# Patient Record
Sex: Female | Born: 1990 | Race: White | Hispanic: No | Marital: Married | Smoking: Former smoker
Health system: Southern US, Community
[De-identification: ages and names within clinical notes are randomized; demographics above are authoritative.]

## PROBLEM LIST (undated history)

## (undated) DIAGNOSIS — G43909 Migraine, unspecified, not intractable, without status migrainosus: Secondary | ICD-10-CM

## (undated) DIAGNOSIS — Z9109 Other allergy status, other than to drugs and biological substances: Secondary | ICD-10-CM

## (undated) DIAGNOSIS — F32A Depression, unspecified: Secondary | ICD-10-CM

## (undated) DIAGNOSIS — F329 Major depressive disorder, single episode, unspecified: Secondary | ICD-10-CM

## (undated) DIAGNOSIS — M6289 Other specified disorders of muscle: Secondary | ICD-10-CM

## (undated) DIAGNOSIS — F419 Anxiety disorder, unspecified: Secondary | ICD-10-CM

## (undated) HISTORY — PX: CYST REMOVAL NECK: SHX6281

## (undated) HISTORY — DX: Anxiety disorder, unspecified: F41.9

---
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
## 2013-04-29 DIAGNOSIS — O09891 Supervision of other high risk pregnancies, first trimester: Secondary | ICD-10-CM | POA: Insufficient documentation

## 2013-04-29 DIAGNOSIS — O09899 Supervision of other high risk pregnancies, unspecified trimester: Secondary | ICD-10-CM | POA: Insufficient documentation

## 2013-04-29 DIAGNOSIS — Z820 Family history of epilepsy and other diseases of the nervous system: Secondary | ICD-10-CM | POA: Insufficient documentation

## 2013-04-29 DIAGNOSIS — F329 Major depressive disorder, single episode, unspecified: Secondary | ICD-10-CM | POA: Insufficient documentation

## 2013-06-17 DIAGNOSIS — O09299 Supervision of pregnancy with other poor reproductive or obstetric history, unspecified trimester: Secondary | ICD-10-CM | POA: Insufficient documentation

## 2013-07-05 DIAGNOSIS — O365990 Maternal care for other known or suspected poor fetal growth, unspecified trimester, not applicable or unspecified: Secondary | ICD-10-CM | POA: Insufficient documentation

## 2013-09-02 DIAGNOSIS — Z7189 Other specified counseling: Secondary | ICD-10-CM | POA: Insufficient documentation

## 2017-07-30 LAB — HM PAP SMEAR: HM Pap smear: NORMAL

## 2018-05-14 DIAGNOSIS — G43909 Migraine, unspecified, not intractable, without status migrainosus: Secondary | ICD-10-CM | POA: Insufficient documentation

## 2018-05-14 DIAGNOSIS — F329 Major depressive disorder, single episode, unspecified: Secondary | ICD-10-CM | POA: Insufficient documentation

## 2018-05-24 ENCOUNTER — Ambulatory Visit
Admission: EM | Admit: 2018-05-24 | Discharge: 2018-05-24 | Disposition: A | Discharge status: Home / Self Care | Payer: Self-pay | Attending: Family Medicine | Admitting: Family Medicine

## 2018-05-24 ENCOUNTER — Other Ambulatory Visit: Payer: Self-pay

## 2018-05-24 DIAGNOSIS — J029 Acute pharyngitis, unspecified: Secondary | ICD-10-CM

## 2018-05-24 DIAGNOSIS — J069 Acute upper respiratory infection, unspecified: Secondary | ICD-10-CM

## 2018-05-24 LAB — RAPID STREP SCREEN (MED CTR MEBANE ONLY): STREPTOCOCCUS, GROUP A SCREEN (DIRECT): NEGATIVE

## 2018-05-24 MED ORDER — LIDOCAINE VISCOUS HCL 2 % MT SOLN: 10.0000 mL | Freq: Four times a day (QID) | OROMUCOSAL | 0 refills | Status: DC | PRN

## 2018-05-24 NOTE — ED Provider Notes (Signed)
[facility] ____________________________________________  Time seen: Approximately 9:32 AM  I have reviewed the triage vital signs and the nursing notes.   HISTORY  Chief Complaint Sore Throat  HPI Dominique Navarro is a 27 y.o. female presenting for evaluation of sore throat present since Friday.  Reports accompanying runny nose, nasal congestion and some cough.  States sore throat is painful with swallowing as well as talking.  Currently moderate.  Has not taken any over-the-counter medications for the same complaints.  Reports her husband has had some similar sickness.  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  Continues to drink fluids well, decreased appetite.  Denies known fevers.  Denies other aggravating alleviating factors.  Reports otherwise feels well. Denies recent sickness. Denies recent antibiotic use.   No LMP recorded. Patient has had an injection. Denies pregnancy.     History reviewed. No pertinent past medical history.  There are no active problems to display for this patient.   Past Surgical History:  Procedure Laterality Date  . CYST REMOVAL NECK       No current facility-administered medications for this encounter.   Current Outpatient Medications:  .  lidocaine (XYLOCAINE) 2 % solution, Use as directed 10 mLs in the mouth or throat every 6 (six) hours as needed (sore throat. gargle and spit as needed for sore throat.)., Disp: 100 mL, Rfl: 0  Allergies Sulfa antibiotics  Family History  Problem Relation Age of Onset  . Asthma Mother   . COPD Father     Social History Social History   Tobacco Use  . Smoking status: Current Some Day Smoker  . Smokeless tobacco: Never Used  Substance Use Topics  . Alcohol use: Not Currently  . Drug use: Not Currently    Review of Systems Constitutional: No fever ENT: Positive sore throat. Cardiovascular: Denies chest pain. Respiratory: Denies shortness of  breath. Gastrointestinal: No abdominal pain.  Musculoskeletal: Negative for back pain. Skin: Negative for rash.   ____________________________________________   PHYSICAL EXAM:  VITAL SIGNS: ED Triage Vitals  Enc Vitals Group     BP 05/24/18 0850 116/86     Pulse Rate 05/24/18 0850 70     Resp 05/24/18 0850 18     Temp 05/24/18 0850 98.3 F (36.8 C)     Temp Source 05/24/18 0850 Oral     SpO2 05/24/18 0850 100 %     Weight 05/24/18 0848 139 lb (63 kg)     Height 05/24/18 0848 5\' 4"  (1.626 m)     Head Circumference --      Peak Flow --      Pain Score 05/24/18 0848 9     Pain Loc --      Pain Edu? --      Excl. in [place]? --     Constitutional: Alert and oriented. Well appearing and in no acute distress. Eyes: Conjunctivae are normal.  Head: Atraumatic. No sinus tenderness to palpation. No swelling. No erythema.  Ears: no erythema, normal TMs bilaterally.   Nose:Nasal congestion with clear rhinorrhea.   Mouth/Throat: Mucous membranes are moist. Mild pharyngeal erythema. No tonsillar swelling or exudate.  Neck: No stridor.  No cervical spine tenderness to palpation. Hematological/Lymphatic/Immunilogical: No cervical lymphadenopathy. Cardiovascular: Normal rate, regular rhythm. Grossly normal heart sounds.  Good peripheral circulation. Respiratory: Normal respiratory effort.  No retractions. No wheezes, rales or rhonchi. Good air movement.  Musculoskeletal: Ambulatory with steady gait.  Neurologic:  Normal speech and language.  No gait instability. Skin:  Skin appears warm, dry and intact. No rash noted. Psychiatric: Mood and affect are normal. Speech and behavior are normal.  ___________________________________________   LABS (all labs ordered are listed, but only abnormal results are displayed)  Labs Reviewed  RAPID STREP SCREEN ([facility] ONLY)  CULTURE, GROUP A STREP [facility])   ____________________________________   PROCEDURES Procedures    INITIAL  IMPRESSION / ASSESSMENT AND PLAN / ED COURSE  Pertinent labs & imaging results that were available during my care of the patient were reviewed by me and considered in my medical decision making (see chart for details).  Well-appearing patient.  No acute distress.  Quick strep negative, will culture.  Suspect viral upper respiratory infection encourage rest, fluids, over-the-counter cough and congestion medications as needed, Rx given for viscous lidocaine for supportive care.  Work note given for today and tomorrow.Discussed indication, risks and benefits of medications with patient.  Discussed follow up with Primary care physician this week. Discussed follow up and return parameters including no resolution or any worsening concerns. Patient verbalized understanding and agreed to plan.   ____________________________________________   FINAL CLINICAL IMPRESSION(S) / ED DIAGNOSES  Final diagnoses:  Pharyngitis, unspecified etiology  Upper respiratory tract infection, unspecified type     ED Discharge Orders         Ordered    lidocaine (XYLOCAINE) 2 % solution  Every 6 hours PRN     05/24/18 0924           Note: This dictation was prepared with Dragon dictation along with smaller phrase technology. Any transcriptional errors that result from this process are unintentional.         Marylene Land, NP 05/24/18 1040

## 2018-05-24 NOTE — ED Triage Notes (Signed)
Patient complains of sore throat that started on Friday. Patient reports painful swallowing and talking.

## 2018-05-24 NOTE — Discharge Instructions (Addendum)
Take medication as prescribed. Rest. Drink plenty of fluids. Over the counter medication as discussed.  ° °Follow up with your primary care physician this week as needed. Return to Urgent care for new or worsening concerns.  ° °

## 2018-05-26 LAB — CULTURE, GROUP A STREP (THRC)

## 2018-06-03 ENCOUNTER — Emergency Department
Admission: EM | Admit: 2018-06-03 | Discharge: 2018-06-03 | Disposition: A | Discharge status: Home / Self Care | Payer: Medicaid Other | Attending: Emergency Medicine | Admitting: Emergency Medicine

## 2018-06-03 ENCOUNTER — Other Ambulatory Visit: Payer: Self-pay

## 2018-06-03 DIAGNOSIS — B9789 Other viral agents as the cause of diseases classified elsewhere: Secondary | ICD-10-CM | POA: Insufficient documentation

## 2018-06-03 DIAGNOSIS — J069 Acute upper respiratory infection, unspecified: Secondary | ICD-10-CM | POA: Diagnosis not present

## 2018-06-03 DIAGNOSIS — F17210 Nicotine dependence, cigarettes, uncomplicated: Secondary | ICD-10-CM | POA: Diagnosis not present

## 2018-06-03 DIAGNOSIS — J020 Streptococcal pharyngitis: Secondary | ICD-10-CM | POA: Diagnosis not present

## 2018-06-03 DIAGNOSIS — J029 Acute pharyngitis, unspecified: Secondary | ICD-10-CM | POA: Diagnosis present

## 2018-06-03 LAB — GROUP A STREP BY PCR: GROUP A STREP BY PCR: NOT DETECTED

## 2018-06-03 MED ORDER — PREDNISONE 20 MG PO TABS
60.0000 mg | ORAL_TABLET | Freq: Once | ORAL | Status: AC
  Administered 2018-06-03: 60 mg via ORAL
  Filled 2018-06-03: qty 3

## 2018-06-03 MED ORDER — PREDNISONE 10 MG (21) PO TBPK: ORAL_TABLET | ORAL | 0 refills | Status: DC

## 2018-06-03 MED ORDER — BENZONATATE 100 MG PO CAPS: ORAL_CAPSULE | ORAL | 0 refills | Status: DC

## 2018-06-03 NOTE — ED Notes (Signed)
Pt ambulatory to POV without difficulty. VSS. NAD. Discharge instructions, RX and follow up reviewed. All questions and concerns addressed.  

## 2018-06-03 NOTE — Discharge Instructions (Addendum)
Your exam was indicative of a viral URI; and rapid strep test were negative. Take the prescription meds as directed. Drink plenty of fluids and gargle with warm salty water. You should start an OTC allergy medicine as well as Delsym (dextromethorphan) for cough relief. Follow-up with [facility] for routine medical care.

## 2018-06-03 NOTE — ED Provider Notes (Signed)
[facility] Emergency Department Provider Note ____________________________________________  Time seen: 1653  I have reviewed the triage vital signs and the nursing notes.  HISTORY  Chief Complaint  Sore Throat  HPI Dominique Navarro is a 27 y.o. female presents herself to the ED for evaluation of cough, congestion, sore throat, and nasal drainage.  Patient describes symptoms started over a week ago.  She denies any sick contacts, recent travel, or other exposures.  She was concerned because she thought she saw some swelling and some "splotches" inside her mouth.  History reviewed. No pertinent past medical history.  There are no active problems to display for this patient.   Past Surgical History:  Procedure Laterality Date  . CYST REMOVAL NECK      Prior to Admission medications   Medication Sig Start Date End Date Taking? Authorizing Provider  benzonatate (TESSALON PERLES) 100 MG capsule Take 1-2 tabs TID prn cough 06/03/18   Torin Modica, Dannielle Karvonen, PA-C  lidocaine (XYLOCAINE) 2 % solution Use as directed 10 mLs in the mouth or throat every 6 (six) hours as needed (sore throat. gargle and spit as needed for sore throat.). 05/24/18   Marylene Land, NP  predniSONE (STERAPRED UNI-PAK 21 TAB) 10 MG (21) TBPK tablet 6-day taper as directed. 06/03/18   Tearia Gibbs, Dannielle Karvonen, PA-C    Allergies Sulfa antibiotics  Family History  Problem Relation Age of Onset  . Asthma Mother   . COPD Father     Social History Social History   Tobacco Use  . Smoking status: Current Some Day Smoker  . Smokeless tobacco: Never Used  Substance Use Topics  . Alcohol use: Not Currently  . Drug use: Not Currently    Review of Systems  Constitutional: Negative for fever. Eyes: Negative for visual changes. ENT: Positive for sore throat.  Reports sinus congestion and nasal drainage Cardiovascular: Negative for chest pain. Respiratory: Negative for shortness of  breath. Gastrointestinal: Negative for abdominal pain, vomiting and diarrhea. Genitourinary: Negative for dysuria. Musculoskeletal: Negative for back pain. Skin: Negative for rash. Neurological: Negative for headaches, focal weakness or numbness. ____________________________________________  PHYSICAL EXAM:  VITAL SIGNS: ED Triage Vitals  Enc Vitals Group     BP 06/03/18 1502 120/68     Pulse Rate 06/03/18 1502 82     Resp 06/03/18 1502 18     Temp 06/03/18 1500 98.7 F (37.1 C)     Temp Source 06/03/18 1500 Oral     SpO2 06/03/18 1502 99 %     Weight 06/03/18 1500 139 lb (63 kg)     Height 06/03/18 1500 5\' 4"  (1.626 m)     Head Circumference --      Peak Flow --      Pain Score 06/03/18 1500 10     Pain Loc --      Pain Edu? --      Excl. in [place]? --     Constitutional: Alert and oriented. Well appearing and in no distress. Head: Normocephalic and atraumatic. Eyes: Conjunctivae are normal. Normal extraocular movements Ears: Canals clear. TMs intact bilaterally. Nose: No congestion/rhinorrhea/epistaxis. Mouth/Throat: Mucous membranes are moist.  Uvula is midline and tonsils are without erythema, edema, or exudates.  No oropharyngeal lesions are noted.  There is some mild erythema to the posterior oropharynx, consistent with likely postnasal drainage. Neck: Supple. No thyromegaly. Hematological/Lymphatic/Immunological: No cervical lymphadenopathy. Cardiovascular: Normal rate, regular rhythm. Normal distal pulses. Respiratory: Normal respiratory effort. No wheezes/rales/rhonchi. Gastrointestinal: Soft and  nontender. No distention. ____________________________________________   LABS (pertinent positives/negatives) Labs Reviewed  GROUP A STREP BY PCR  ____________________________________________  PROCEDURES  Procedures Prednisone 60 mg PO ____________________________________________  INITIAL IMPRESSION / ASSESSMENT AND PLAN / ED COURSE  She with ED evaluation of sore  throat and cough with sinus congestion.  Patient's exam is overall benign.  Her strep PCR was negative at this time for bacterial etiology to her sore throat.  Symptoms likely represent a viral etiology versus sore throat due to postnasal drainage and cough.  She will be treated empirically with steroids, Tessalon Perles, and is encouraged to take over-the-counter Delsym for symptom relief.  She should also consider starting a daily allergy medicine.  Patient will follow-up with local community clinic for ongoing symptoms or return to the ED as needed.  Work note is provided for today as requested. ____________________________________________  FINAL CLINICAL IMPRESSION(S) / ED DIAGNOSES  Final diagnoses:  Viral URI with cough  Strep throat      Tyeasha Ebbs, Dannielle Karvonen, PA-C 06/04/18 0000    Arta Silence, MD 06/04/18 1601

## 2018-06-03 NOTE — ED Notes (Signed)
See triage note  Presents with sore throat for the past couple of days  No fever  States pain increases with swallowing

## 2018-06-03 NOTE — ED Triage Notes (Signed)
Pt comes with c/o sore throat that started over a week ago. Pt states she has swelling and some splotches noticed inside her mouth.

## 2019-04-28 ENCOUNTER — Other Ambulatory Visit: Payer: Self-pay

## 2019-04-28 ENCOUNTER — Ambulatory Visit (LOCAL_COMMUNITY_HEALTH_CENTER): Payer: Medicaid Other

## 2019-04-28 VITALS — BP 105/68 | Ht 64.0 in | Wt 150.5 lb

## 2019-04-28 DIAGNOSIS — Z3009 Encounter for other general counseling and advice on contraception: Secondary | ICD-10-CM

## 2019-04-28 DIAGNOSIS — Z30013 Encounter for initial prescription of injectable contraceptive: Secondary | ICD-10-CM

## 2019-04-28 MED ORDER — MEDROXYPROGESTERONE ACETATE 150 MG/ML IM SUSP
150.0000 mg | Freq: Once | INTRAMUSCULAR | Status: AC
  Administered 2019-04-28: 150 mg via INTRAMUSCULAR

## 2019-04-28 NOTE — Progress Notes (Signed)
Depo administered per 05/14/2018 written order of Jerline Pain FNP-BC. Client tolerated without complaint. Rich Number, RN

## 2019-05-02 ENCOUNTER — Other Ambulatory Visit: Payer: Self-pay

## 2019-05-02 ENCOUNTER — Ambulatory Visit
Admission: EM | Admit: 2019-05-02 | Discharge: 2019-05-02 | Disposition: A | Discharge status: Home / Self Care | Payer: Medicaid Other | Attending: Family Medicine | Admitting: Family Medicine

## 2019-05-02 DIAGNOSIS — K0889 Other specified disorders of teeth and supporting structures: Secondary | ICD-10-CM | POA: Diagnosis not present

## 2019-05-02 MED ORDER — HYDROCODONE-ACETAMINOPHEN 5-325 MG PO TABS: 1.0000 | ORAL_TABLET | Freq: Three times a day (TID) | ORAL | 0 refills | Status: DC | PRN

## 2019-05-02 MED ORDER — LIDOCAINE VISCOUS HCL 2 % MT SOLN: OROMUCOSAL | 0 refills | Status: DC

## 2019-05-02 MED ORDER — AMOXICILLIN-POT CLAVULANATE 875-125 MG PO TABS: 1.0000 | ORAL_TABLET | Freq: Two times a day (BID) | ORAL | 0 refills | Status: DC

## 2019-05-02 NOTE — ED Provider Notes (Signed)
[facility]    CSN: [id number] Arrival date & time: 05/02/19  1046  History   Chief Complaint Chief Complaint  Patient presents with  . Dental Pain   HPI  28 year old female presents with dental pain.  Patient reports pain associated with her right lower last tooth.  She states that this is a wisdom tooth that is bothering her.  It has erupted through the gum but is now causing pain.  Pain is 7/10 in severity.  Reports some associated swelling of her right lower jaw.  No fever.  No chills.  She has been unable to get in to see a dentist.  She has taken ibuprofen without relief.  She reports difficulty eating but is able to drink.  Exacerbated by touch.  Pending factors.  No other associated symptoms.  No other complaints.  PMH, Surgical Hx, Family Hx, Social History reviewed and updated as below.  PMH: Patient Active Problem List   Diagnosis Date Noted  . Depression 05/14/2018  . Migraine headache 05/14/2018    Past Surgical History:  Procedure Laterality Date  . CYST REMOVAL NECK      OB History   No obstetric history on file.      Home Medications    Prior to Admission medications   Medication Sig Start Date End Date Taking? Authorizing Provider  amoxicillin-clavulanate (AUGMENTIN) 875-125 MG tablet Take 1 tablet by mouth every 12 (twelve) hours. 05/02/19   Coral Spikes, DO  HYDROcodone-acetaminophen (NORCO/VICODIN) 5-325 MG tablet Take 1 tablet by mouth every 8 (eight) hours as needed. 05/02/19   Coral Spikes, DO  lidocaine (XYLOCAINE) 2 % solution Apply a small amount to the affected area every 3 hours as needed. 05/02/19   Coral Spikes, DO  Multiple Vitamin (MULTIVITAMIN WITH MINERALS) TABS tablet Take 1 tablet by mouth daily.    [provider]    Family History Family History  Problem Relation Age of Onset  . Asthma Mother   . COPD Father   . Seizures Son     Social History Social History   Tobacco Use  . Smoking status: Former  Smoker    Types: Cigarettes    Quit date: 02/23/2019    Years since quitting: 0.1  . Smokeless tobacco: Never Used  Substance Use Topics  . Alcohol use: Not Currently  . Drug use: Not Currently     Allergies   Bee venom and Sulfa antibiotics   Review of Systems Review of Systems  Constitutional: Negative.   HENT: Positive for dental problem.    Physical Exam Triage Vital Signs ED Triage Vitals  Enc Vitals Group     BP 05/02/19 1102 124/90     Pulse Rate 05/02/19 1102 77     Resp 05/02/19 1102 18     Temp 05/02/19 1102 98.9 F (37.2 C)     Temp Source 05/02/19 1102 Oral     SpO2 05/02/19 1102 99 %     Weight 05/02/19 1104 150 lb (68 kg)     Height --      Head Circumference --      Peak Flow --      Pain Score 05/02/19 1104 7     Pain Loc --      Pain Edu? --      Excl. in [place]? --    Updated Vital Signs BP 124/90 (BP Location: Right Arm)   Pulse 77   Temp 98.9 F (37.2 C) (Oral)  Resp 18   Wt 68 kg   LMP  (LMP Unknown)   SpO2 99%   BMI 25.75 kg/m   Visual Acuity Right Eye Distance:   Left Eye Distance:   Bilateral Distance:    Right Eye Near:   Left Eye Near:    Bilateral Near:     Physical Exam Vitals signs and nursing note reviewed.  Constitutional:      General: She is not in acute distress.    Appearance: Normal appearance. She is normal weight. She is not ill-appearing.  HENT:     Head: Normocephalic and atraumatic.     Mouth/Throat:     Pharynx: Oropharynx is clear. No posterior oropharyngeal erythema.      Comments: Tooth pushing through the gum.  Mild surrounding erythema.  Tender to palpation. Eyes:     General:        Right eye: No discharge.        Left eye: No discharge.     Conjunctiva/sclera: Conjunctivae normal.  Pulmonary:     Effort: Pulmonary effort is normal. No respiratory distress.  Skin:    General: Skin is warm.     Findings: No rash.  Neurological:     Mental Status: She is alert.  Psychiatric:        Mood and  Affect: Mood normal.        Behavior: Behavior normal.    UC Treatments / Results  Labs (all labs ordered are listed, but only abnormal results are displayed) Labs Reviewed - No data to display  EKG   Radiology No results found.  Procedures Procedures (including critical care time)  Medications Ordered in UC Medications - No data to display  Initial Impression / Assessment and Plan / UC Course  I have reviewed the triage vital signs and the nursing notes.  Pertinent labs & imaging results that were available during my care of the patient were reviewed by me and considered in my medical decision making (see chart for details).    28 year old female presents with dental pain.  Concern for developing infection.  Placing on Augmentin.  Viscous lidocaine as needed.  Vicodin as needed. [place] Controlled substance database reviewed.  No concerns at this time  Final Clinical Impressions(s) / UC Diagnoses   Final diagnoses:  Pain, dental     Discharge Instructions     Try [facility] in [place].  Medications as directed.  Take care  Dr. Lacinda Axon    ED Prescriptions    Medication Sig Dispense Auth. Provider   amoxicillin-clavulanate (AUGMENTIN) 875-125 MG tablet  (Status: Discontinued) Take 1 tablet by mouth every 12 (twelve) hours. 14 tablet Alberto Schoch G, DO   HYDROcodone-acetaminophen (NORCO/VICODIN) 5-325 MG tablet  (Status: Discontinued) Take 1 tablet by mouth every 8 (eight) hours as needed. 10 tablet Indigo Barbian G, DO   lidocaine (XYLOCAINE) 2 % solution  (Status: Discontinued) Apply a small amount to the affected area every 3 hours as needed. 100 mL Azalie Harbeck G, DO   amoxicillin-clavulanate (AUGMENTIN) 875-125 MG tablet Take 1 tablet by mouth every 12 (twelve) hours. 14 tablet Abdul Beirne G, DO   HYDROcodone-acetaminophen (NORCO/VICODIN) 5-325 MG tablet Take 1 tablet by mouth every 8 (eight) hours as needed. 10 tablet Norberto Wishon G, DO   lidocaine (XYLOCAINE) 2 %  solution Apply a small amount to the affected area every 3 hours as needed. 100 mL Coral Spikes, DO     Controlled Substance Prescriptions  Controlled Substance  Registry consulted? Yes, I have consulted the [place] Controlled Substances Registry for this patient, and feel the risk/benefit ratio today is favorable for proceeding with this prescription for a controlled substance.   Coral Spikes, [place] 05/02/19 1209

## 2019-05-02 NOTE — ED Triage Notes (Signed)
Pt here for impacted wisdom tooth on the right side of her mouth that has been hurting her since Saturday. Does not have a dentist since she just got insurance. Pt states she feels some swelling on the right side of her face. Did take ibuprofen without relief.

## 2019-05-02 NOTE — Discharge Instructions (Signed)
Try [facility] in [place].  Medications as directed.  Take care  Dr. Lacinda Axon

## 2019-05-19 ENCOUNTER — Ambulatory Visit: Payer: Medicaid Other | Admitting: Physician Assistant

## 2019-05-19 ENCOUNTER — Other Ambulatory Visit: Payer: Self-pay

## 2019-05-19 ENCOUNTER — Encounter: Payer: Self-pay | Admitting: Physician Assistant

## 2019-05-19 VITALS — BP 105/75 | HR 80 | Temp 97.1°F | Ht 64.0 in | Wt 153.0 lb

## 2019-05-19 DIAGNOSIS — Z862 Personal history of diseases of the blood and blood-forming organs and certain disorders involving the immune mechanism: Secondary | ICD-10-CM

## 2019-05-19 DIAGNOSIS — R5383 Other fatigue: Secondary | ICD-10-CM | POA: Diagnosis not present

## 2019-05-19 DIAGNOSIS — M2550 Pain in unspecified joint: Secondary | ICD-10-CM | POA: Diagnosis not present

## 2019-05-19 DIAGNOSIS — F329 Major depressive disorder, single episode, unspecified: Secondary | ICD-10-CM

## 2019-05-19 DIAGNOSIS — G43809 Other migraine, not intractable, without status migrainosus: Secondary | ICD-10-CM

## 2019-05-19 DIAGNOSIS — M791 Myalgia, unspecified site: Secondary | ICD-10-CM

## 2019-05-19 DIAGNOSIS — H547 Unspecified visual loss: Secondary | ICD-10-CM

## 2019-05-19 DIAGNOSIS — F32A Depression, unspecified: Secondary | ICD-10-CM

## 2019-05-19 MED ORDER — AMITRIPTYLINE HCL 10 MG PO TABS: 10.0000 mg | ORAL_TABLET | Freq: Every day | ORAL | 1 refills | Status: DC

## 2019-05-19 MED ORDER — DULOXETINE HCL 20 MG PO CPEP: 20.0000 mg | ORAL_CAPSULE | Freq: Every day | ORAL | 0 refills | Status: DC

## 2019-05-19 NOTE — Progress Notes (Signed)
Patient: Dominique Navarro Female    DOB: 11/06/1990   28 y.o.   MRN: [id number] Visit Date: 05/19/2019  Today's Provider: Trinna Post, PA-C   Chief Complaint  Patient presents with  . Establish Care   Subjective:     HPI   Moved from [place] in 11/2017  Just got insurance now. Originally from [place] area. She works at [employer]. She has an 23 year old son and 67 year old daughter. She is currently getting the depo short for birth control. She does not get periods on the depo shot.   History of Migraines. She was previously on elavil 10 mg nightly and would would like to restart this.   History of Depression. She was previously on Celexa. pt does not remember the dose but would like to restart. She is open to other medications. She went through a stressful time in her life and felt like the medication stopped working.  History of low Iron needs to be rechecked and is worried this may be contributing to fatigue.   Pt reports her body hurts all the time, "Every single joint and it has been getting worse". Reports this has been happening for months. She says her knees hurt when she goes up the stairs. She reports she feels tired frequently. She says the pain is unbearable. She has no injuries that she can recall. Denies family history of rheumatoid arthritis. She does not have any rashes that she knows of and has not been told she has psoriasis. She reports she took a tylenol with codeine for dental pain and the pain improved. She reports when she gets up she feels very stiff but denies morning stiffness that resolves in an hour.   Needs a referral for eye exam to get new glasses.  Allergies  Allergen Reactions  . Bee Venom Hives  . Sulfa Antibiotics Hives     Current Outpatient Medications:  Marland Kitchen  Multiple Vitamin (MULTIVITAMIN WITH MINERALS) TABS tablet, Take 1 tablet by mouth daily., Disp: , Rfl:  .  amitriptyline (ELAVIL) 10 MG tablet, Take 1 tablet (10 mg total) by mouth at bedtime.,  Disp: 90 tablet, Rfl: 1 .  DULoxetine (CYMBALTA) 20 MG capsule, Take 1 capsule (20 mg total) by mouth daily., Disp: 90 capsule, Rfl: 0  Review of Systems  Constitutional: Positive for fatigue. Negative for activity change, appetite change, chills, diaphoresis, fever and unexpected weight change.  HENT: Positive for dental problem. Negative for congestion, drooling, ear discharge, ear pain, facial swelling, hearing loss, mouth sores, nosebleeds, postnasal drip, rhinorrhea, sinus pressure, sinus pain, sneezing, sore throat, tinnitus, trouble swallowing and voice change.   Eyes: Negative.   Respiratory: Negative.   Cardiovascular: Negative.   Gastrointestinal: Negative.   Endocrine: Negative.   Genitourinary: Negative.   Musculoskeletal: Positive for arthralgias, back pain, joint swelling and neck pain. Negative for gait problem, myalgias and neck stiffness.  Skin: Negative.   Allergic/Immunologic: Negative.   Neurological: Negative.   Hematological: Negative.   Psychiatric/Behavioral: Positive for sleep disturbance. Negative for agitation, behavioral problems, confusion, decreased concentration, dysphoric mood, hallucinations, self-injury and suicidal ideas. The patient is nervous/anxious. The patient is not hyperactive.     Social History   Tobacco Use  . Smoking status: Former Smoker    Types: Cigarettes    Quit date: 02/23/2019    Years since quitting: 0.2  . Smokeless tobacco: Never Used  Substance Use Topics  . Alcohol use: Never    Frequency:  Never      Objective:   BP 105/75 (BP Location: Left Arm, Patient Position: Sitting, Cuff Size: Normal)   Pulse 80   Temp (!) 97.1 F (36.2 C) (Temporal)   Ht '5\' 4"'  (1.626 m)   Wt 153 lb (69.4 kg)   LMP  (LMP Unknown)   BMI 26.26 kg/m  Vitals:   05/19/19 1025  BP: 105/75  Pulse: 80  Temp: (!) 97.1 F (36.2 C)  TempSrc: Temporal  Weight: 153 lb (69.4 kg)  Height: '5\' 4"'  (1.626 m)  Body mass index is 26.26 kg/m.   Physical  Exam   No results found for any visits on 05/19/19.     Assessment & Plan    1. Other migraine without status migrainosus, not intractable  Will restart as below.  - amitriptyline (ELAVIL) 10 MG tablet; Take 1 tablet (10 mg total) by mouth at bedtime.  Dispense: 90 tablet; Refill: 1  2. Depression, unspecified depression type  We will change celexa to cymbalta due to its use in [facility] pain. Counseled on risk of serotonin syndrome and what to observe for.   - DULoxetine (CYMBALTA) 20 MG capsule; Take 1 capsule (20 mg total) by mouth daily.  Dispense: 90 capsule; Refill: 0  3. Other fatigue  - CBC with Differential - Comprehensive Metabolic Panel (CMET) - TSH - Sed Rate (ESR) - C-reactive protein  4. Arthralgia, unspecified joint  Have gone over common causes of diffuse muscle pain including osteoarthritis, inflammatory arthritis, pain syndromes, viral illnesses. I think it would be unlikely that she has osteoarthritis due to age. No family history of inflammatory arthritis. Her symptoms are not consistent with inflammatory arthritis but I think it's reasonable to get initial labs. Also counseled about possibility of pain syndrome like fibromyalgia. Will refer to rheum for further workup. Start cymbalta as mentioned above and hopefully this will help both depression and MSK pain.   - DULoxetine (CYMBALTA) 20 MG capsule; Take 1 capsule (20 mg total) by mouth daily.  Dispense: 90 capsule; Refill: 0 - Rheumatoid Factor - ANA - Ambulatory referral to Rheumatology  5. Muscle pain  - DULoxetine (CYMBALTA) 20 MG capsule; Take 1 capsule (20 mg total) by mouth daily.  Dispense: 90 capsule; Refill: 0  6. History of anemia  - Fe+TIBC+Fer  7. Vision loss  - Ambulatory referral to Ophthalmology  The entirety of the information documented in the History of Present Illness, Review of Systems and Physical Exam were personally obtained by me. Portions of this information were initially  documented by Ashley Royalty, CMA and reviewed by me for thoroughness and accuracy.   F/u 1 month depression    Trinna Post, PA-C  [facility]

## 2019-05-19 NOTE — Patient Instructions (Signed)
Depression Screening Depression screening is a tool that your health care provider can use to learn if you have symptoms of depression. Depression is a common condition with many symptoms that are also often found in other conditions. Depression is treatable, but it must first be diagnosed. You may not know that certain feelings, thoughts, and behaviors that you are having can be symptoms of depression. Taking a depression screening test can help you and your health care provider decide if you need more assessment, or if you should be referred to a mental health care provider. What are the screening tests?  You may have a physical exam to see if another condition is affecting your mental health. You may have a blood or urine sample taken during the physical exam.  You may be interviewed using a screening tool that was developed from research, such as one of these: ? Patient Health Questionnaire (PHQ). This is a set of either 2 or 9 questions. A health care provider who has been trained to score this screening test uses a guide to assess if your symptoms suggest that you may have depression. ? Hamilton Depression Rating Scale (HAM-D). This is a set of either 17 or 24 questions. You may be asked to take it again during or after your treatment, to see if your depression has gotten better. ? Beck Depression Inventory (BDI). This is a set of 21 multiple choice questions. Your health care provider scores your answers to assess:  Your level of depression, ranging from mild to severe.  Your response to treatment.  Your health care provider may talk with you about your daily activities, such as eating, sleeping, work, and recreation, and ask if you have had any changes in activity.  Your health care provider may ask you to see a mental health specialist, such as a psychiatrist or psychologist, for more evaluation. Who should be screened for depression?   All adults, including adults with a family history  of a mental health disorder.  Adolescents who are 12-18 years old.  People who are recovering from a myocardial infarction (MI).  Pregnant women, or women who have given birth.  People who have a long-term (chronic) illness.  Anyone who has been diagnosed with another type of a mental health disorder.  Anyone who has symptoms that could show depression. What do my results mean? Your health care provider will review the results of your depression screening, physical exam, and lab tests. Positive screens suggest that you may have depression. Screening is the first step in getting the care that you may need. It is up to you to get your screening results. Ask your health care provider, or the department that is doing your screening tests, when your results will be ready. Talk with your health care provider about your results and diagnosis. A diagnosis of depression is made using the Diagnostic and Statistical Manual of Mental Disorders (DSM-V). This is a book that lists the number and type of symptoms that must be present for a health care provider to give a specific diagnosis.  Your health care provider may work with you to treat your symptoms of depression, or your health care provider may help you find a mental health provider who can assess, diagnose, and treat your depression. Get help right away if:  You have thoughts about hurting yourself or others. If you ever feel like you may hurt yourself or others, or have thoughts about taking your own life, get help right away. You   can go to your nearest emergency department or call:  Your local emergency services (911 in the [place]  A suicide crisis helpline, such as the National Suicide Prevention Lifeline at 1-800-273-8255. This is open 24 hours a day. Summary  Depression screening is the first step in getting the help that you may need.  If your screening test shows symptoms of depression (is positive), your health care provider may ask  you to see a mental health provider.  Anyone who is age 12 or older should be screened for depression. This information is not intended to replace advice given to you by your health care provider. Make sure you discuss any questions you have with your health care provider. Document Released: 12/26/2016 Document Revised: 07/24/2017 Document Reviewed: 12/26/2016 Elsevier Patient Education  2020 Elsevier Inc[employer]  

## 2019-05-20 LAB — SEDIMENTATION RATE: Sed Rate: 2 mm/hr (ref 0–32)

## 2019-05-20 LAB — TSH: TSH: 1.64 u[IU]/mL (ref 0.450–4.500)

## 2019-05-20 LAB — CBC WITH DIFFERENTIAL/PLATELET
Basophils Absolute: 0.1 10*3/uL (ref 0.0–0.2)
Basos: 1 %
EOS (ABSOLUTE): 0.3 10*3/uL (ref 0.0–0.4)
Eos: 5 %
Hematocrit: 40.6 % (ref 34.0–46.6)
Hemoglobin: 13.8 g/dL (ref 11.1–15.9)
Immature Grans (Abs): 0 10*3/uL (ref 0.0–0.1)
Immature Granulocytes: 0 %
Lymphocytes Absolute: 2.2 10*3/uL (ref 0.7–3.1)
Lymphs: 38 %
MCH: 28.1 pg (ref 26.6–33.0)
MCHC: 34 g/dL (ref 31.5–35.7)
MCV: 83 fL (ref 79–97)
Monocytes Absolute: 0.5 10*3/uL (ref 0.1–0.9)
Monocytes: 9 %
Neutrophils Absolute: 2.7 10*3/uL (ref 1.4–7.0)
Neutrophils: 47 %
Platelets: 241 10*3/uL (ref 150–450)
RBC: 4.91 x10E6/uL (ref 3.77–5.28)
RDW: 12.4 % (ref 11.7–15.4)
WBC: 5.7 10*3/uL (ref 3.4–10.8)

## 2019-05-20 LAB — COMPREHENSIVE METABOLIC PANEL
ALT: 22 IU/L (ref 0–32)
AST: 21 IU/L (ref 0–40)
Albumin/Globulin Ratio: 2.6 — ABNORMAL HIGH (ref 1.2–2.2)
Albumin: 5.2 g/dL — ABNORMAL HIGH (ref 3.9–5.0)
Alkaline Phosphatase: 60 IU/L (ref 39–117)
BUN/Creatinine Ratio: 13 (ref 9–23)
BUN: 12 mg/dL (ref 6–20)
Bilirubin Total: 0.4 mg/dL (ref 0.0–1.2)
CO2: 18 mmol/L — ABNORMAL LOW (ref 20–29)
Calcium: 9.3 mg/dL (ref 8.7–10.2)
Chloride: 108 mmol/L — ABNORMAL HIGH (ref 96–106)
Creatinine, Ser: 0.94 mg/dL (ref 0.57–1.00)
GFR calc Af Amer: 95 mL/min/{1.73_m2} (ref >59)
GFR calc non Af Amer: 82 mL/min/{1.73_m2} (ref >59)
Globulin, Total: 2 g/dL (ref 1.5–4.5)
Glucose: 84 mg/dL (ref 65–99)
Potassium: 4 mmol/L (ref 3.5–5.2)
Sodium: 141 mmol/L (ref 134–144)
Total Protein: 7.2 g/dL (ref 6.0–8.5)

## 2019-05-20 LAB — IRON,TIBC AND FERRITIN PANEL
Ferritin: 73 ng/mL (ref 15–150)
Iron Saturation: 62 % — ABNORMAL HIGH (ref 15–55)
Iron: 138 ug/dL (ref 27–159)
Total Iron Binding Capacity: 222 ug/dL — ABNORMAL LOW (ref 250–450)
UIBC: 84 ug/dL — ABNORMAL LOW (ref 131–425)

## 2019-05-20 LAB — C-REACTIVE PROTEIN: CRP: <1 mg/L (ref 0–10)

## 2019-05-20 LAB — RHEUMATOID FACTOR: Rhuematoid fact SerPl-aCnc: <10 IU/mL (ref 0.0–13.9)

## 2019-05-20 LAB — ANA: Anti Nuclear Antibody (ANA): NEGATIVE

## 2019-05-24 ENCOUNTER — Telehealth: Payer: Self-pay | Admitting: Physician Assistant

## 2019-06-06 DIAGNOSIS — G4709 Other insomnia: Secondary | ICD-10-CM | POA: Insufficient documentation

## 2019-06-06 DIAGNOSIS — Z13820 Encounter for screening for osteoporosis: Secondary | ICD-10-CM | POA: Insufficient documentation

## 2019-06-06 DIAGNOSIS — M2550 Pain in unspecified joint: Secondary | ICD-10-CM | POA: Insufficient documentation

## 2019-06-08 ENCOUNTER — Telehealth: Payer: Self-pay | Admitting: Physician Assistant

## 2019-06-08 NOTE — Telephone Encounter (Signed)
Please advise 

## 2019-06-08 NOTE — Telephone Encounter (Signed)
It looks like the rheumatologist wanted to see her back in 4 weeks and she has an appointment on 07/13/2019. I think it would make sense to follow up but if she wants another referral I will place it. The outcome may not be different, however.

## 2019-06-08 NOTE — Telephone Encounter (Signed)
°  Pt requesting a 2nd Rheumatologist referral.  She didn't feel the 1st one listened and couldn't find anything wrong.  Please call pt back to let her know if this can be done and the status.  Thanks, [employer]

## 2019-06-08 NOTE — Telephone Encounter (Signed)
Patient was advised. Patient stated she will keep follow up appt 07/13/2019 with rheumatologist.

## 2019-07-12 ENCOUNTER — Ambulatory Visit (INDEPENDENT_AMBULATORY_CARE_PROVIDER_SITE_OTHER): Payer: Medicaid Other | Admitting: Physician Assistant

## 2019-07-12 DIAGNOSIS — F32A Depression, unspecified: Secondary | ICD-10-CM

## 2019-07-12 DIAGNOSIS — G43009 Migraine without aura, not intractable, without status migrainosus: Secondary | ICD-10-CM

## 2019-07-12 DIAGNOSIS — F329 Major depressive disorder, single episode, unspecified: Secondary | ICD-10-CM | POA: Diagnosis not present

## 2019-07-12 MED ORDER — SUMATRIPTAN SUCCINATE 50 MG PO TABS: 50.0000 mg | ORAL_TABLET | ORAL | 1 refills | Status: DC | PRN

## 2019-07-12 NOTE — Progress Notes (Signed)
Patient: Dominique Navarro Female    DOB: Sep 25, 1990   28 y.o.   MRN: [id number] Visit Date: 07/12/2019  Today's Provider: Trinna Post, PA-C   Chief Complaint  Patient presents with  . Depression   Subjective:    Virtual Visit via Video Note  I connected with Dominique Navarro on [id number] at  9:40 AM EST by a video enabled telemedicine application and verified that I am speaking with the correct person using two identifiers.  Location: Patient: Home Provider: Office   I discussed the limitations of evaluation and management by telemedicine and the availability of in person appointments. The patient expressed understanding and agreed to proceed.  HPI Depression Patient presents today virtually for depression follow-up. Patient last office visit was on 05/19/2019. Patient Celexa was changed to Cymbalta. Patient denies any side effects at the moment from the medication.  Patient reports improvement in migraines and restarting Elavil. Had some that did break through.   Rheumatology: follow up tomorrow. She did get meloxicam and flexeril from doctor. She reports flexeril helping significantly.  Depression screen [facility] 2/9 07/12/2019 05/19/2019  Decreased Interest 0 1  Down, Depressed, Hopeless 0 2  PHQ - 2 Score 0 3  Altered sleeping 2 2  Tired, decreased energy 1 3  Change in appetite 0 1  Feeling bad or failure about yourself  0 1  Trouble concentrating 0 0  Moving slowly or fidgety/restless 0 0  Suicidal thoughts 0 0  PHQ-9 Score 3 10  Difficult doing work/chores Not difficult at all Not difficult at all    Allergies  Allergen Reactions  . Bee Venom Hives  . Sulfa Antibiotics Hives     Current Outpatient Medications:  .  amitriptyline (ELAVIL) 10 MG tablet, Take 1 tablet (10 mg total) by mouth at bedtime., Disp: 90 tablet, Rfl: 1 .  DULoxetine (CYMBALTA) 20 MG capsule, Take 1 capsule (20 mg total) by mouth daily., Disp: 90 capsule, Rfl: 0 .  Multiple Vitamin  (MULTIVITAMIN WITH MINERALS) TABS tablet, Take 1 tablet by mouth daily., Disp: , Rfl:   Review of Systems  Social History   Tobacco Use  . Smoking status: Former Smoker    Types: Cigarettes    Quit date: 02/23/2019    Years since quitting: 0.3  . Smokeless tobacco: Never Used  Substance Use Topics  . Alcohol use: Never    Frequency: Never      Objective:   There were no vitals taken for this visit. There were no vitals filed for this visit.There is no height or weight on file to calculate BMI.   Physical Exam Constitutional:      Appearance: Normal appearance.  Pulmonary:     Effort: Pulmonary effort is normal. No respiratory distress.  Neurological:     Mental Status: She is alert.  Psychiatric:        Mood and Affect: Mood normal.        Behavior: Behavior normal.      No results found for any visits on 07/12/19.     Assessment & Plan    1. Migraine without aura and without status migrainosus, not intractable  Reports migraines have improved but had 1-2 break through migraines in the past month that did not respond to OTC medications. Will prescribe as below. Continue elavil 10 mg QHS.   - SUMAtriptan (IMITREX) 50 MG tablet; Take 1 tablet (50 mg total) by mouth every 2 (two) hours as needed for migraine.  May repeat in 2 hours if headache persists or recurs.  Dispense: 10 tablet; Refill: 1  2. Depression, unspecified depression type  Continue Cymbalta 20 mg QD. Symptoms have improved. She follows up with rheumatology tomorrow. Flexeril may be option for night time pain on PRN basis due to interactions with current medications.    I discussed the assessment and treatment plan with the patient. The patient was provided an opportunity to ask questions and all were answered. The patient agreed with the plan and demonstrated an understanding of the instructions.   The patient was advised to call back or seek an in-person evaluation if the symptoms worsen or if the  condition fails to improve as anticipated.  I provided 25 minutes of non-face-to-face time during this encounter.  The entirety of the information documented in the History of Present Illness, Review of Systems and Physical Exam were personally obtained by me. Portions of this information were initially documented by [facility], CMA and reviewed by me for thoroughness and accuracy.   F/u 6 months - 1 year      Trinna Post, PA-C  [facility]

## 2019-08-05 ENCOUNTER — Telehealth: Payer: Self-pay | Admitting: Physician Assistant

## 2019-08-05 ENCOUNTER — Telehealth (INDEPENDENT_AMBULATORY_CARE_PROVIDER_SITE_OTHER): Payer: Medicaid Other | Admitting: Physician Assistant

## 2019-08-05 DIAGNOSIS — Z5329 Procedure and treatment not carried out because of patient's decision for other reasons: Secondary | ICD-10-CM

## 2019-08-05 NOTE — Telephone Encounter (Signed)
From [facility] 

## 2019-08-05 NOTE — Progress Notes (Signed)
Patient was called multiple times at her designated appointment slot. No answer. Left message to call back. This will be considered a no-show.

## 2019-08-05 NOTE — Telephone Encounter (Signed)
Patient stated since got her pet rabbit, she has had itchy eyes, runny nose and sneezing. Patient states she symptoms began after getting the rabbit and she only has symptoms when she is around the pet. Please advise?

## 2019-08-05 NOTE — Telephone Encounter (Signed)
Medication Refill - Medication: something for allergies   Has the patient contacted their pharmacy? No. (Agent: If no, request that the patient contact the pharmacy for the refill.) (Agent: If yes, when and what did the pharmacy advise?)  Preferred Pharmacy (with phone number or street name):[facility] [place],   cb for pt is  [phone] Pt got a bunny 1 week ago and has been sneezing all week.  Pt thinks that she is allergic to bunny but does not want to give it away.  Pt is asking for meds to be called in. Willing to do virtual visit if needed    Agent: Please be advised that RX refills may take up to 3 business days. We ask that you follow-up with your pharmacy.

## 2019-08-05 NOTE — Telephone Encounter (Signed)
She can do a telephone visit at 2:40 PM

## 2019-08-05 NOTE — Telephone Encounter (Signed)
Patient scheduled mychart visit for today at 2:40 pm.

## 2019-08-08 ENCOUNTER — Ambulatory Visit (INDEPENDENT_AMBULATORY_CARE_PROVIDER_SITE_OTHER): Payer: Medicaid Other | Admitting: Physician Assistant

## 2019-08-08 DIAGNOSIS — Z9109 Other allergy status, other than to drugs and biological substances: Secondary | ICD-10-CM | POA: Diagnosis not present

## 2019-08-08 DIAGNOSIS — J0110 Acute frontal sinusitis, unspecified: Secondary | ICD-10-CM

## 2019-08-08 MED ORDER — MONTELUKAST SODIUM 10 MG PO TABS: 10.0000 mg | ORAL_TABLET | Freq: Every day | ORAL | 0 refills | Status: DC

## 2019-08-08 MED ORDER — AMOXICILLIN-POT CLAVULANATE 875-125 MG PO TABS: 1.0000 | ORAL_TABLET | Freq: Two times a day (BID) | ORAL | 0 refills | Status: AC

## 2019-08-08 NOTE — Patient Instructions (Addendum)
Xyzal: 5 mg once daily Singulair 10 mg once daily.    Sinusitis, Adult Sinusitis is soreness and swelling (inflammation) of your sinuses. Sinuses are hollow spaces in the bones around your face. They are located:  Around your eyes.  In the middle of your forehead.  Behind your nose.  In your cheekbones. Your sinuses and nasal passages are lined with a fluid called mucus. Mucus drains out of your sinuses. Swelling can trap mucus in your sinuses. This lets germs (bacteria, virus, or fungus) grow, which leads to infection. Most of the time, this condition is caused by a virus. What are the causes? This condition is caused by:  Allergies.  Asthma.  Germs.  Things that block your nose or sinuses.  Growths in the nose (nasal polyps).  Chemicals or irritants in the air.  Fungus (rare). What increases the risk? You are more likely to develop this condition if:  You have a weak body defense system (immune system).  You do a lot of swimming or diving.  You use nasal sprays too much.  You smoke. What are the signs or symptoms? The main symptoms of this condition are pain and a feeling of pressure around the sinuses. Other symptoms include:  Stuffy nose (congestion).  Runny nose (drainage).  Swelling and warmth in the sinuses.  Headache.  Toothache.  A cough that may get worse at night.  Mucus that collects in the throat or the back of the nose (postnasal drip).  Being unable to smell and taste.  Being very tired (fatigue).  A fever.  Sore throat.  Bad breath. How is this diagnosed? This condition is diagnosed based on:  Your symptoms.  Your medical history.  A physical exam.  Tests to find out if your condition is short-term (acute) or long-term (chronic). Your doctor may: ? Check your nose for growths (polyps). ? Check your sinuses using a tool that has a light (endoscope). ? Check for allergies or germs. ? Do imaging tests, such as an MRI or CT  scan. How is this treated? Treatment for this condition depends on the cause and whether it is short-term or long-term.  If caused by a virus, your symptoms should go away on their own within 10 days. You may be given medicines to relieve symptoms. They include: ? Medicines that shrink swollen tissue in the nose. ? Medicines that treat allergies (antihistamines). ? A spray that treats swelling of the nostrils. ? Rinses that help get rid of thick mucus in your nose (nasal saline washes).  If caused by bacteria, your doctor may wait to see if you will get better without treatment. You may be given antibiotic medicine if you have: ? A very bad infection. ? A weak body defense system.  If caused by growths in the nose, you may need to have surgery. Follow these instructions at home: Medicines  Take, use, or apply over-the-counter and prescription medicines only as told by your doctor. These may include nasal sprays.  If you were prescribed an antibiotic medicine, take it as told by your doctor. Do not stop taking the antibiotic even if you start to feel better. Hydrate and humidify   Drink enough water to keep your pee (urine) pale yellow.  Use a cool mist humidifier to keep the humidity level in your home above 50%.  Breathe in steam for 10-15 minutes, 3-4 times a day, or as told by your doctor. You can do this in the bathroom while a hot shower  is running.  Try not to spend time in cool or dry air. Rest  Rest as much as you can.  Sleep with your head raised (elevated).  Make sure you get enough sleep each night. General instructions   Put a warm, moist washcloth on your face 3-4 times a day, or as often as told by your doctor. This will help with discomfort.  Wash your hands often with soap and water. If there is no soap and water, use hand sanitizer.  Do not smoke. Avoid being around people who are smoking (secondhand smoke).  Keep all follow-up visits as told by your  doctor. This is important. Contact a doctor if:  You have a fever.  Your symptoms get worse.  Your symptoms do not get better within 10 days. Get help right away if:  You have a very bad headache.  You cannot stop throwing up (vomiting).  You have very bad pain or swelling around your face or eyes.  You have trouble seeing.  You feel confused.  Your neck is stiff.  You have trouble breathing. Summary  Sinusitis is swelling of your sinuses. Sinuses are hollow spaces in the bones around your face.  This condition is caused by tissues in your nose that become inflamed or swollen. This traps germs. These can lead to infection.  If you were prescribed an antibiotic medicine, take it as told by your doctor. Do not stop taking it even if you start to feel better.  Keep all follow-up visits as told by your doctor. This is important. This information is not intended to replace advice given to you by your health care provider. Make sure you discuss any questions you have with your health care provider. Document Released: 01/28/2008 Document Revised: 01/11/2018 Document Reviewed: 01/11/2018 Elsevier Patient Education  2020 [employer].

## 2019-08-08 NOTE — Progress Notes (Signed)
Dominique Navarro  MRN: [id number] DOB: 03/04/1991  Subjective:  HPI   Virtual Visit via Telephone Note  I connected with Dominique Navarro on [id number] at  2:40 PM EST by telephone and verified that I am speaking with the correct person using two identifiers.   I discussed the limitations, risks, security and privacy concerns of performing an evaluation and management service by telephone and the availability of in person appointments. I also discussed with the patient that there may be a patient responsible charge related to this service. The patient expressed understanding and agreed to proceed.  The patient is a 28 year old female who presents for possible sinus infection. She has had sinus symptoms for about 2 weeks.  They include congestion, fullness in her ears, sinus drainage, headaches and some cough.  She has tried 5 different allergy medications: She has tried benadryl, zyrtec, claritin, allegra. Started taking allegra 07/21/2019 x 1 week and then switched over to claritin. DayQuil and NyQuill, Ibuprofen  And Tylenol.  She states that she just feels she can't deal with it anymore and would like to get treatment.   Patient reports getting new rabbit and worsening allergies since then.   Patient Active Problem List   Diagnosis Date Noted  . Depression 05/14/2018  . Migraine headache 05/14/2018    No past medical history on file.  Social History   Socioeconomic History  . Marital status: Married    Spouse name: Not on file  . Number of children: Not on file  . Years of education: Not on file  . Highest education level: Not on file  Occupational History  . Not on file  Tobacco Use  . Smoking status: Former Smoker    Types: Cigarettes    Quit date: 02/23/2019    Years since quitting: 0.4  . Smokeless tobacco: Never Used  Substance and Sexual Activity  . Alcohol use: Never  . Drug use: Never  . Sexual activity: Not on file  Other Topics Concern  . Not on file  Social History  Narrative  . Not on file   Social Determinants of Health   Financial Resource Strain:   . Difficulty of Paying Living Expenses: Not on file  Food Insecurity:   . Worried About Charity fundraiser in the Last Year: Not on file  . Ran Out of Food in the Last Year: Not on file  Transportation Needs:   . Lack of Transportation (Medical): Not on file  . Lack of Transportation (Non-Medical): Not on file  Physical Activity:   . Days of Exercise per Week: Not on file  . Minutes of Exercise per Session: Not on file  Stress:   . Feeling of Stress : Not on file  Social Connections:   . Frequency of Communication with Friends and Family: Not on file  . Frequency of Social Gatherings with Friends and Family: Not on file  . Attends Religious Services: Not on file  . Active Member of Clubs or Organizations: Not on file  . Attends Archivist Meetings: Not on file  . Marital Status: Not on file  Intimate Partner Violence:   . Fear of Current or Ex-Partner: Not on file  . Emotionally Abused: Not on file  . Physically Abused: Not on file  . Sexually Abused: Not on file    Outpatient Encounter Medications as of 08/08/2019  Medication Sig  . amitriptyline (ELAVIL) 10 MG tablet Take 1 tablet (10 mg total) by mouth at  bedtime.  . DULoxetine (CYMBALTA) 20 MG capsule Take 1 capsule (20 mg total) by mouth daily.  . Multiple Vitamin (MULTIVITAMIN WITH MINERALS) TABS tablet Take 1 tablet by mouth daily.  . SUMAtriptan (IMITREX) 50 MG tablet Take 1 tablet (50 mg total) by mouth every 2 (two) hours as needed for migraine. May repeat in 2 hours if headache persists or recurs.  Marland Kitchen amoxicillin-clavulanate (AUGMENTIN) 875-125 MG tablet Take 1 tablet by mouth 2 (two) times daily for 7 days.  . montelukast (SINGULAIR) 10 MG tablet Take 1 tablet (10 mg total) by mouth at bedtime.   No facility-administered encounter medications on file as of 08/08/2019.    Allergies  Allergen Reactions  . Bee Venom  Hives  . Sulfa Antibiotics Hives    Review of Systems  Constitutional: Negative for chills, diaphoresis, fever and malaise/fatigue.  HENT: Positive for congestion, ear pain, sinus pain and sore throat. Negative for tinnitus.   Respiratory: Positive for cough and sputum production (yellow). Negative for shortness of breath and wheezing.   Cardiovascular: Negative for chest pain.  Gastrointestinal: Negative for abdominal pain and diarrhea.  Musculoskeletal: Positive for myalgias.  Neurological: Positive for headaches. Negative for dizziness.    Objective:  There were no vitals taken for this visit.  Physical Exam  Pulmonary/Chest: No respiratory distress.  Patient talking in complete sentences without issue.     Assessment and Plan :  1. Acute non-recurrent frontal sinusitis  - amoxicillin-clavulanate (AUGMENTIN) 875-125 MG tablet; Take 1 tablet by mouth 2 (two) times daily for 7 days.  Dispense: 14 tablet; Refill: 0  2. Environmental allergies  Recommend she take xyzal with singulair. Recommend she evaluate if rabbit's bedding or food such as hay could be causing allergy. Suggest keeping rabbit out of her bedroom, washing her hands after handling the animal.   - montelukast (SINGULAIR) 10 MG tablet; Take 1 tablet (10 mg total) by mouth at bedtime.  Dispense: 90 tablet; Refill: 0  The entirety of the information documented in the History of Present Illness, Review of Systems and Physical Exam were personally obtained by me. Portions of this information were initially documented by Althea Charon, CMA and reviewed by me for thoroughness and accuracy.   F/u PRN

## 2019-08-13 ENCOUNTER — Other Ambulatory Visit: Payer: Self-pay | Admitting: Physician Assistant

## 2019-08-13 DIAGNOSIS — M791 Myalgia, unspecified site: Secondary | ICD-10-CM

## 2019-08-13 DIAGNOSIS — F329 Major depressive disorder, single episode, unspecified: Secondary | ICD-10-CM

## 2019-08-13 DIAGNOSIS — M2550 Pain in unspecified joint: Secondary | ICD-10-CM

## 2019-08-13 DIAGNOSIS — F32A Depression, unspecified: Secondary | ICD-10-CM

## 2019-08-31 ENCOUNTER — Ambulatory Visit (INDEPENDENT_AMBULATORY_CARE_PROVIDER_SITE_OTHER): Payer: Medicaid Other | Admitting: Physician Assistant

## 2019-08-31 DIAGNOSIS — G43809 Other migraine, not intractable, without status migrainosus: Secondary | ICD-10-CM

## 2019-08-31 MED ORDER — PROMETHAZINE HCL 12.5 MG PO TABS: 12.5000 mg | ORAL_TABLET | Freq: Three times a day (TID) | ORAL | 1 refills | Status: DC | PRN

## 2019-08-31 MED ORDER — AMITRIPTYLINE HCL 25 MG PO TABS: 25.0000 mg | ORAL_TABLET | Freq: Every day | ORAL | 0 refills | Status: DC

## 2019-08-31 NOTE — Progress Notes (Signed)
Patient: Dominique Navarro Female    DOB: 08-09-1991   29 y.o.   MRN: [id number] Visit Date: 08/31/2019  Today's Provider: Trinna Post, PA-C   Chief Complaint  Patient presents with  . Migraine   Subjective:    I, Dominique Navarro,CMA am acting as a Education administrator for [employer].  Virtual Visit via Telephone Note  I connected with Dominique Navarro on [id number] at 11:00 AM EST by telephone and verified that I am speaking with the correct person using two identifiers.  Location: Patient: Home Provider: Office    I discussed the limitations, risks, security and privacy concerns of performing an evaluation and management service by telephone and the availability of in person appointments. I also discussed with the patient that there may be a patient responsible charge related to this service. The patient expressed understanding and agreed to proceed.  HPI  Migraine  Patient presents today for migraine follow-up. Patient was last seen on 07/12/2019 and started on Simatriptan 50 MG. Patient states with 1 hour of taking the medication she has nausea. Patient states that she has been on the same medication before in the pass and had to take a nausea medication. Currently on elavil for migraine prophylaxis. Patient reports increased migraines in the past two weeks.    Allergies  Allergen Reactions  . Bee Venom Hives  . Sulfa Antibiotics Hives     Current Outpatient Medications:  .  amitriptyline (ELAVIL) 10 MG tablet, Take 1 tablet (10 mg total) by mouth at bedtime., Disp: 90 tablet, Rfl: 1 .  DULoxetine (CYMBALTA) 20 MG capsule, TAKE 1 CAPSULE BY MOUTH EVERY DAY, Disp: 90 capsule, Rfl: 0 .  montelukast (SINGULAIR) 10 MG tablet, Take 1 tablet (10 mg total) by mouth at bedtime., Disp: 90 tablet, Rfl: 0 .  Multiple Vitamin (MULTIVITAMIN WITH MINERALS) TABS tablet, Take 1 tablet by mouth daily., Disp: , Rfl:  .  SUMAtriptan (IMITREX) 50 MG tablet, Take 1 tablet (50 mg total) by mouth  every 2 (two) hours as needed for migraine. May repeat in 2 hours if headache persists or recurs., Disp: 10 tablet, Rfl: 1  Review of Systems  Constitutional: Negative.   Cardiovascular: Negative.   Gastrointestinal: Positive for nausea.  Neurological: Negative.     Social History   Tobacco Use  . Smoking status: Former Smoker    Types: Cigarettes    Quit date: 02/23/2019    Years since quitting: 0.5  . Smokeless tobacco: Never Used  Substance Use Topics  . Alcohol use: Never      Objective:   There were no vitals taken for this visit. There were no vitals filed for this visit.There is no height or weight on file to calculate BMI.   Physical Exam   No results found for any visits on 08/31/19.     Assessment & Plan    1. Other migraine without status migrainosus, not intractable  Counseled on risk of serotonin syndrome with cymbalta. Increase elavil as below for worsening migraines and PRN nausea medicine.   - amitriptyline (ELAVIL) 25 MG tablet; Take 1 tablet (25 mg total) by mouth at bedtime.  Dispense: 90 tablet; Refill: 0 - promethazine (PHENERGAN) 12.5 MG tablet; Take 1 tablet (12.5 mg total) by mouth every 8 (eight) hours as needed for nausea or vomiting.  Dispense: 20 tablet; Refill: 1 I discussed the assessment and treatment plan with the patient. The patient was provided an opportunity to ask questions and all  were answered. The patient agreed with the plan and demonstrated an understanding of the instructions.   The patient was advised to call back or seek an in-person evaluation if the symptoms worsen or if the condition fails to improve as anticipated.  The entirety of the information documented in the History of Present Illness, Review of Systems and Physical Exam were personally obtained by me. Portions of this information were initially documented by [facility] and reviewed by me for thoroughness and accuracy.      Trinna Post, PA-C  [facility]

## 2019-09-01 ENCOUNTER — Encounter: Payer: Self-pay | Admitting: Physician Assistant

## 2019-09-01 ENCOUNTER — Ambulatory Visit (INDEPENDENT_AMBULATORY_CARE_PROVIDER_SITE_OTHER): Payer: Medicaid Other | Admitting: Physician Assistant

## 2019-09-01 ENCOUNTER — Other Ambulatory Visit: Payer: Self-pay

## 2019-09-01 VITALS — Temp 97.3°F | Wt 161.8 lb

## 2019-09-01 DIAGNOSIS — Z3042 Encounter for surveillance of injectable contraceptive: Secondary | ICD-10-CM

## 2019-09-01 LAB — POCT URINE PREGNANCY: Preg Test, Ur: NEGATIVE

## 2019-09-01 MED ORDER — MEDROXYPROGESTERONE ACETATE 150 MG/ML IM SUSP
150.0000 mg | Freq: Once | INTRAMUSCULAR | Status: AC
  Administered 2019-09-01: 150 mg via INTRAMUSCULAR

## 2019-09-01 NOTE — Progress Notes (Signed)
       Patient: Dominique Navarro Female    DOB: 01/14/1991   29 y.o.   MRN: [id number] Visit Date: 09/01/2019  Today's Provider: Trinna Post, PA-C   Chief Complaint  Patient presents with  . Contraception   Subjective:    I, [name],CMA am acting as a Education administrator for [employer]. HPI  Contraception Patient presents today for depo-provera injection. Patient last injection was done on 04/28/2019 at [facility]. A urine pregnancy was done due to patient been late and was negative. Patient tolerate injection well.   Allergies  Allergen Reactions  . Bee Venom Hives  . Sulfa Antibiotics Hives     Current Outpatient Medications:  .  amitriptyline (ELAVIL) 25 MG tablet, Take 1 tablet (25 mg total) by mouth at bedtime., Disp: 90 tablet, Rfl: 0 .  DULoxetine (CYMBALTA) 20 MG capsule, TAKE 1 CAPSULE BY MOUTH EVERY DAY, Disp: 90 capsule, Rfl: 0 .  montelukast (SINGULAIR) 10 MG tablet, Take 1 tablet (10 mg total) by mouth at bedtime., Disp: 90 tablet, Rfl: 0 .  Multiple Vitamin (MULTIVITAMIN WITH MINERALS) TABS tablet, Take 1 tablet by mouth daily., Disp: , Rfl:  .  promethazine (PHENERGAN) 12.5 MG tablet, Take 1 tablet (12.5 mg total) by mouth every 8 (eight) hours as needed for nausea or vomiting., Disp: 20 tablet, Rfl: 1 .  SUMAtriptan (IMITREX) 50 MG tablet, Take 1 tablet (50 mg total) by mouth every 2 (two) hours as needed for migraine. May repeat in 2 hours if headache persists or recurs., Disp: 10 tablet, Rfl: 1  Review of Systems  Constitutional: Negative.   Respiratory: Negative.   Cardiovascular: Negative.   Gastrointestinal: Negative.   Musculoskeletal: Negative.   Neurological: Negative.     Social History   Tobacco Use  . Smoking status: Former Smoker    Types: Cigarettes    Quit date: 02/23/2019    Years since quitting: 0.5  . Smokeless tobacco: Never Used  Substance Use Topics  . Alcohol use: Never      Objective:   Temp (!) 97.3  F (36.3 C) (Temporal)   Wt 161 lb 12.8 oz (73.4 kg)   BMI 27.77 kg/m  Vitals:   09/01/19 1055  Temp: (!) 97.3 F (36.3 C)  TempSrc: Temporal  Weight: 161 lb 12.8 oz (73.4 kg)  Body mass index is 27.77 kg/m.   Physical Exam   Results for orders placed or performed in visit on 09/01/19  POCT urine pregnancy  Result Value Ref Range   Preg Test, Ur Negative Negative       Assessment & [place], PA-C  [facility]

## 2019-09-07 ENCOUNTER — Encounter: Payer: Self-pay | Admitting: Emergency Medicine

## 2019-09-07 ENCOUNTER — Telehealth: Payer: Self-pay

## 2019-09-07 ENCOUNTER — Other Ambulatory Visit: Payer: Self-pay

## 2019-09-07 ENCOUNTER — Ambulatory Visit
Admission: EM | Admit: 2019-09-07 | Discharge: 2019-09-07 | Disposition: A | Discharge status: Home / Self Care | Payer: Medicaid Other | Attending: Urgent Care | Admitting: Urgent Care

## 2019-09-07 DIAGNOSIS — Z20822 Contact with and (suspected) exposure to covid-19: Secondary | ICD-10-CM

## 2019-09-07 DIAGNOSIS — Z7189 Other specified counseling: Secondary | ICD-10-CM

## 2019-09-07 DIAGNOSIS — G43909 Migraine, unspecified, not intractable, without status migrainosus: Secondary | ICD-10-CM | POA: Diagnosis present

## 2019-09-07 HISTORY — DX: Migraine, unspecified, not intractable, without status migrainosus: G43.909

## 2019-09-07 HISTORY — DX: Depression, unspecified: F32.A

## 2019-09-07 MED ORDER — DEXAMETHASONE SODIUM PHOSPHATE 10 MG/ML IJ SOLN
10.0000 mg | Freq: Once | INTRAMUSCULAR | Status: AC
  Administered 2019-09-07: 10 mg via INTRAMUSCULAR

## 2019-09-07 MED ORDER — KETOROLAC TROMETHAMINE 30 MG/ML IJ SOLN
30.0000 mg | Freq: Once | INTRAMUSCULAR | Status: AC
  Administered 2019-09-07: 30 mg via INTRAMUSCULAR

## 2019-09-07 NOTE — Discharge Instructions (Addendum)
It was very nice seeing you today in clinic. Thank you for entrusting me with your care.   Rest and increase fluid intake. May use Tylenol as needed for headache if needed today. Follow up with PCP to discuss referral to neurology. In the interim, continue amitriptyline and sumatriptan as prescribed.   You were tested for SARS-CoV-2 (novel coronavirus) today. Testing is performed by an outside lab ([facility]) and has variable turn around times ranging between 2-5 days. Current recommendations from the the CDC and [place] DHHS require that you remain out of work in order to quarantine at home until negative test results are have been received. In the event that your test results are positive, you will be contacted with further directives. These measures are being implemented out of an abundance of caution to prevent transmission and spread during the current SARS-CoV-2 pandemic.  If your symptoms/condition worsens, please seek follow up care either here or in the ER. Please remember, our [place] providers are "right here with you" when you need [place].   Again, it was my pleasure to take care of you today. Thank you for choosing our clinic. I hope that you start to feel better quickly.   Honor Loh, MSN, APRN, FNP-C, CEN Advanced Practice Provider [facility]

## 2019-09-07 NOTE — Telephone Encounter (Signed)
Patient reports headache started today, along with nausea, dizziness, hot and cold flashes. Patient reports temp is 98.0. Patient reports taking sumatriptan 50 mg, reports it has not help. Patient reports ear pain and body ache, denies any sore throat, or fever.

## 2019-09-07 NOTE — Telephone Encounter (Signed)
Copied from [place] [phone]. Topic: General - Inquiry >> Sep 07, 2019  2:38 PM Mathis Bud wrote: Reason for CRM: Patient is calling to report that she has been having serve headaches, Patient is requesting nurse to call back asap. Call back [phone]

## 2019-09-07 NOTE — ED Triage Notes (Addendum)
Patient in today c/o a migraine headache since this morning. Patient has a history of migraines. Patient has taken Imitrex and Ibuprofen with some relief.

## 2019-09-07 NOTE — ED Provider Notes (Signed)
[place], [place]   Name: Dominique Navarro DOB: [id number] MRN: [id number] CSN: [id number] PCP: Trinna Post, PA-C  Arrival date and time:  09/07/19 1821  Chief Complaint:  Migraine   NOTE: Prior to seeing the patient today, I have reviewed the triage nursing documentation and vital signs. Clinical staff has updated patient's PMH/PSHx, current medication list, and drug allergies/intolerances to ensure comprehensive history available to assist in medical decision making.   History:   HPI: Dominique Navarro is a 29 y.o. female who presents today with complaints of a migraine headache that began with acute onset earlier today. Patient does a PMH significant for migraines and notes that this headache is similar to her past episodes. Migraines have been occurring more frequently as of late. She reports having 4 migraines since 08/26/2019, where she normally only has had 1 headache a month in the past. She notes some concurrent myalgias, dizziness, and pain that starts behind her ears and extends down into her neck. She denies preceding aura or known triggers. LMP 6 years ago; on Depo injections. She has experienced associated nausea, but denies any actual emesis episodes or photophobia. She is eating and drinking normally. Patient denies focal weakness, nuchal rigidity, changes to her vision, AMS, ataxia, or difficulties with her speech. Patient contacted her PCP office and was advised to come to urgent care for further evaluation and consideration of SARS-CoV-2 (novel coronavirus) testing. Patient is on both preventative (amitriptyline) and abortive (sumatriptan) therapies for the management of her migraine headaches. She has taken both today, in addition to IBU and promethazine. Patient states, "I am actually feeling some better. The headache is better. They told me to come on in to get tested anyway".  Patient denies being in close contact with anyone known to be ill. She has never been tested for SARS-CoV-2  (novel coronavirus) per her report. She has not been vaccinated for influenza this year.   Past Medical History:  Diagnosis Date   Depression    Migraine     Past Surgical History:  Procedure Laterality Date   CYST REMOVAL NECK      Family History  Problem Relation Age of Onset   Asthma Mother    COPD Mother    COPD Father    Seizures Son     Social History   Tobacco Use   Smoking status: Former Smoker    Types: Cigarettes    Quit date: 02/23/2019    Years since quitting: 0.5   Smokeless tobacco: Never Used  Substance Use Topics   Alcohol use: Never   Drug use: Never    Patient Active Problem List   Diagnosis Date Noted   Depression 05/14/2018   Migraine headache 05/14/2018    Home Medications:    Current Meds  Medication Sig   amitriptyline (ELAVIL) 25 MG tablet Take 1 tablet (25 mg total) by mouth at bedtime.   DULoxetine (CYMBALTA) 20 MG capsule TAKE 1 CAPSULE BY MOUTH EVERY DAY   medroxyPROGESTERone (DEPO-PROVERA) 150 MG/ML injection Inject 150 mg into the muscle every 3 (three) months.   montelukast (SINGULAIR) 10 MG tablet Take 1 tablet (10 mg total) by mouth at bedtime.   Multiple Vitamin (MULTIVITAMIN WITH MINERALS) TABS tablet Take 1 tablet by mouth daily.   promethazine (PHENERGAN) 12.5 MG tablet Take 1 tablet (12.5 mg total) by mouth every 8 (eight) hours as needed for nausea or vomiting.   SUMAtriptan (IMITREX) 50 MG tablet Take 1 tablet (50 mg total) by mouth every  2 (two) hours as needed for migraine. May repeat in 2 hours if headache persists or recurs.    Allergies:   Bee venom and Sulfa antibiotics  Review of Systems (ROS): Review of Systems  Constitutional: Negative for chills and fever.  HENT: Negative for congestion, sinus pain and sore throat.   Respiratory: Negative for cough and shortness of breath.   Cardiovascular: Negative for chest pain and palpitations.  Gastrointestinal: Positive for nausea. Negative for  abdominal pain and vomiting.  Musculoskeletal: Positive for neck pain.  Skin: Negative for color change, pallor and rash.  Neurological: Positive for headaches. Negative for syncope, weakness and numbness.  All other systems reviewed and are negative.    Vital Signs: Today's Vitals   09/07/19 1833 09/07/19 1834 09/07/19 1951  BP:  107/74   Pulse:  88   Resp:  18   Temp:  98.6 F (37 C)   TempSrc:  Oral   SpO2:  100%   Weight:  160 lb (72.6 kg)   Height:  5\' 4"  (1.626 m)   PainSc: 6   0-No pain    Physical Exam: Physical Exam  Constitutional: She is oriented to person, place, and time and well-developed, well-nourished, and in no distress.  HENT:  Head: Normocephalic and atraumatic.  Eyes: Pupils are equal, round, and reactive to light.  Cardiovascular: Normal rate.  Pulmonary/Chest: Effort normal. No respiratory distress.  Musculoskeletal:     Cervical back: Normal range of motion and neck supple.  Neurological: She is alert and oriented to person, place, and time. She has normal sensation, normal strength, normal reflexes and intact cranial nerves. Gait normal.  Skin: Skin is warm and dry. No rash noted. She is not diaphoretic.  Psychiatric: Mood, memory, affect and judgment normal.  Nursing note and vitals reviewed.   Urgent Care Treatments / Results:   Orders Placed This Encounter  Procedures   Novel Coronavirus, NAA (Hosp order, Send-out to Ref Lab; TAT 18-24 hrs    LABS: PLEASE NOTE: all labs that were ordered this encounter are listed, however only abnormal results are displayed. Labs Reviewed  NOVEL CORONAVIRUS, NAA (HOSP ORDER, SEND-OUT TO REF LAB; TAT 18-24 HRS)    EKG: -None  RADIOLOGY: No results found.  PROCEDURES: Procedures  MEDICATIONS RECEIVED THIS VISIT: Medications  dexamethasone (DECADRON) injection 10 mg (10 mg Intramuscular Given 09/07/19 1926)  ketorolac (TORADOL) 30 MG/ML injection 30 mg (30 mg Intramuscular Given 09/07/19 1925)     PERTINENT CLINICAL COURSE NOTES/UPDATES:   Initial Impression / Assessment and Plan / Urgent Care Course:  Pertinent labs & imaging results that were available during my care of the patient were personally reviewed by me and considered in my medical decision making (see lab/imaging section of note for values and interpretations).  Jeriann Hallen is a 29 y.o. female who presents to [facility] today with complaints of Migraine  Patient overall well appearing and in no acute distress today in clinic. Presenting symptoms (see HPI) and exam as documented above. She presents with symptoms associated with SARS-CoV-2 (novel coronavirus). Discussed typical symptom constellation. Reviewed potential for infection and need for testing. Patient amenable to being tested. SARS-CoV-2 swab collected by certified clinical staff. Discussed variable turn around times associated with testing, as swabs are being processed at [facility], and have been taking between 2-5 days to come back. She was advised to self quarantine, per [facility] DHHS guidelines, until negative results received. These measures are being implemented out of an abundance of caution  to prevent transmission and spread during the current SARS-CoV-2 pandemic.  Migraine headache today; similar to previous. She has taken her prophylactic and abortive therapies, in addition to phenergan and IBU, with some noted relief. Headache 6/10 upon arrival. Patient treated in clinic with dexamethasone 10 mg and ketorolac 30 mg IM, which relieved her headache. Discussed need to rest and increase oral hydration. Reminded patient that until ruled out with confirmatory lab testing, SARS-CoV-2 remains part of the differential. Her testing is pending at this time. Patient to continue currently prescribed interventions for recurrent headache. May take APAP tonight if needed. She was encouraged to follow up with her PCP to discuss referral to neurology for further evaluation of her  headaches given the fact that they have been increasing in frequency; verbalized understanding.   Current clinical condition warrants patient being out of work in order to quarantine while waiting for testing results. She was provided with the appropriate documentation to provide to her place of employment that will allow for her to RTW on 09/10/2019 with no restrictions. RTW is contingent on her SARS-CoV-2 test results being reviewed as negative.   Discussed follow up with primary care physician in 1 week for re-evaluation. I have reviewed the follow up and strict return precautions for any new or worsening symptoms. Patient is aware of symptoms that would be deemed urgent/emergent, and would thus require further evaluation either here or in the emergency department. At the time of discharge, she verbalized understanding and consent with the discharge plan as it was reviewed with her. All questions were fielded by provider and/or clinic staff prior to patient discharge.    Final Clinical Impressions / Urgent Care Diagnoses:   Final diagnoses:  Migraine without status migrainosus, not intractable, unspecified migraine type  Encounter for laboratory testing for COVID-19 virus  Advice given about COVID-19 virus infection    New Prescriptions:  [place] Controlled Substance Registry consulted? Not Applicable  Meds ordered this encounter  Medications   dexamethasone (DECADRON) injection 10 mg   ketorolac (TORADOL) 30 MG/ML injection 30 mg    Recommended Follow up Care:  Patient encouraged to follow up with the following provider within the specified time frame, or sooner as dictated by the severity of her symptoms. As always, she was instructed that for any urgent/emergent care needs, she should seek care either here or in the emergency department for more immediate evaluation.  Follow-up Information    Trinna Post, PA-C In 1 week.   Specialty: Physician Assistant Why: General reassessment  of symptoms if not improving Contact information: [address] [place] [address] [phone]         NOTE: This note was prepared using Dragon dictation software along with smaller phrase technology. Despite my best ability to proofread, there is the potential that transcriptional errors may still occur from this process, and are completely unintentional.    Karen Kitchens, NP 09/07/19 2133

## 2019-09-07 NOTE — Telephone Encounter (Signed)
Patient reports headache started today, along with nausea, dizziness, hot and cold flashes. Patient reports temp is 98.0. Patient reports taking sumatriptan 50 mg, reports it has not help. Patient reports ear pain and body ache, denies any sore throat, or fever. Patient reports that her daughter has an appointment to have teeth pulled ot tomorrow. I advised patient to go to the ER if headache is really bad. Patient reports she will try to go to and Urgent Care to get tested for COVID-19. Patient denies any positive COVID exposure.    Copied from [place] [phone]. Topic: General - Inquiry >> Sep 07, 2019  2:38 PM Mathis Bud wrote: Reason for CRM: Patient is calling to report that she has been having serve headaches, Patient is requesting nurse to call back asap. Call back [phone]

## 2019-09-07 NOTE — ED Triage Notes (Signed)
Patient also took Phenergan today.

## 2019-09-08 ENCOUNTER — Telehealth: Payer: Self-pay | Admitting: Physician Assistant

## 2019-09-08 DIAGNOSIS — G43809 Other migraine, not intractable, without status migrainosus: Secondary | ICD-10-CM

## 2019-09-08 NOTE — Telephone Encounter (Signed)
LMTCB, okay for PEC to advise patient.  

## 2019-09-08 NOTE — Telephone Encounter (Signed)
If she is having muscle aches and dizziness then she should go to urgent care

## 2019-09-08 NOTE — Telephone Encounter (Signed)
Copied from [place] [phone]. Topic: General - Other >> Sep 08, 2019  8:38 AM Keene Breath wrote: Reason for CRM: Patient requests a referral for a neurologist.  CB# [phone].

## 2019-09-08 NOTE — Telephone Encounter (Signed)
Called to advise pt referral placed, left voice message.

## 2019-09-08 NOTE — Telephone Encounter (Signed)
Will forward to PCP for review

## 2019-09-09 LAB — NOVEL CORONAVIRUS, NAA (HOSP ORDER, SEND-OUT TO REF LAB; TAT 18-24 HRS): SARS-CoV-2, NAA: NOT DETECTED

## 2019-09-16 DIAGNOSIS — G43119 Migraine with aura, intractable, without status migrainosus: Secondary | ICD-10-CM | POA: Insufficient documentation

## 2019-10-31 ENCOUNTER — Other Ambulatory Visit: Payer: Self-pay | Admitting: Acute Care

## 2019-10-31 DIAGNOSIS — G379 Demyelinating disease of central nervous system, unspecified: Secondary | ICD-10-CM

## 2019-11-09 ENCOUNTER — Ambulatory Visit
Admission: RE | Admit: 2019-11-09 | Discharge: 2019-11-09 | Disposition: A | Discharge status: Home / Self Care | Payer: Medicaid Other | Source: Ambulatory Visit | Attending: Acute Care | Admitting: Acute Care

## 2019-11-09 ENCOUNTER — Other Ambulatory Visit: Payer: Self-pay

## 2019-11-09 DIAGNOSIS — G379 Demyelinating disease of central nervous system, unspecified: Secondary | ICD-10-CM | POA: Diagnosis not present

## 2019-11-09 MED ORDER — GADOBUTROL 1 MMOL/ML IV SOLN
7.0000 mL | Freq: Once | INTRAVENOUS | Status: AC | PRN
  Administered 2019-11-09: 7 mL via INTRAVENOUS

## 2019-11-14 ENCOUNTER — Other Ambulatory Visit: Payer: Self-pay | Admitting: Physician Assistant

## 2019-11-14 DIAGNOSIS — Z9109 Other allergy status, other than to drugs and biological substances: Secondary | ICD-10-CM

## 2019-11-14 DIAGNOSIS — F329 Major depressive disorder, single episode, unspecified: Secondary | ICD-10-CM

## 2019-11-14 DIAGNOSIS — F32A Depression, unspecified: Secondary | ICD-10-CM

## 2019-11-14 DIAGNOSIS — M791 Myalgia, unspecified site: Secondary | ICD-10-CM

## 2019-11-14 DIAGNOSIS — M2550 Pain in unspecified joint: Secondary | ICD-10-CM

## 2019-11-14 NOTE — Telephone Encounter (Signed)
Requested Prescriptions  Pending Prescriptions Disp Refills  . DULoxetine (CYMBALTA) 20 MG capsule [Pharmacy Med Name: DULOXETINE HCL DR 20 MG CAP] 90 capsule 1    Sig: TAKE 1 CAPSULE BY MOUTH EVERY DAY     Psychiatry: Antidepressants - SNRI Passed - 11/14/2019  7:11 PM      Passed - Completed PHQ-2 or PHQ-9 in the last 360 days.      Passed - Last BP in normal range    BP Readings from Last 1 Encounters:  09/07/19 107/74         Passed - Valid encounter within last 6 months    Recent Outpatient Visits          2 months ago Other migraine without status migrainosus, not intractable   [place], [place], [place]   3 months ago Acute non-recurrent frontal sinusitis   [facility] Car[place] Collet [place], [place]   3 months ago No-show for appointment   [facility] [place] M, PA-C   4 months ago Migraine without aura and without status migrainosus, not intractable   [place], [place], [place]   5 months ago Other migraine without status migrainosus, not intractable   [place], PA-C             . montelukast (SINGULAIR) 10 MG tablet [Pharmacy Med Name: MONTELUKAST SOD 10 MG TABLET] 90 tablet 0    Sig: TAKE 1 TABLET BY MOUTH EVERYDAY AT BEDTIME     Pulmonology:  Leukotriene Inhibitors Passed - 11/14/2019  7:11 PM      Passed - Valid encounter within last 12 months    Recent Outpatient Visits          2 months ago Other migraine without status migrainosus, not intractable   [place], PA-C   3 months ago Acute non-recurrent frontal sinusitis   [facility] [place], [place], [place]   3 months ago No-show for appointment   [facility] Carles Collet M, PA-C   4 months ago Migraine without aura and without status migrainosus, not intractable   [place], PA-C   5 months  ago Other migraine without status migrainosus, not intractable   [place], [place], [place]

## 2019-11-18 ENCOUNTER — Ambulatory Visit (INDEPENDENT_AMBULATORY_CARE_PROVIDER_SITE_OTHER): Payer: Medicaid Other | Admitting: Physician Assistant

## 2019-11-18 ENCOUNTER — Encounter: Payer: Self-pay | Admitting: Physician Assistant

## 2019-11-18 ENCOUNTER — Other Ambulatory Visit: Payer: Self-pay

## 2019-11-18 VITALS — Temp 96.6°F | Wt 166.7 lb

## 2019-11-18 DIAGNOSIS — Z3042 Encounter for surveillance of injectable contraceptive: Secondary | ICD-10-CM

## 2019-11-18 MED ORDER — MEDROXYPROGESTERONE ACETATE 150 MG/ML IM SUSP
150.0000 mg | Freq: Once | INTRAMUSCULAR | Status: AC
  Administered 2019-11-18: 150 mg via INTRAMUSCULAR

## 2019-11-18 NOTE — Progress Notes (Signed)
       Patient: Dominique Navarro Female    DOB: [birth year]   29 y.o.   MRN: [id number] Visit Date: 11/18/2019  Today's Provider: Trinna Post, PA-C   Chief Complaint  Patient presents with  . Contraception   Subjective:     HPI  Contraception Patient presents today for depo-provera injection. Patient last injection was done on 09/01/2019. Patient tolerate injection well.    Allergies  Allergen Reactions  . Bee Venom Hives  . Sulfa Antibiotics Hives     Current Outpatient Medications:  .  amitriptyline (ELAVIL) 25 MG tablet, Take 1 tablet (25 mg total) by mouth at bedtime., Disp: 90 tablet, Rfl: 0 .  DULoxetine (CYMBALTA) 20 MG capsule, TAKE 1 CAPSULE BY MOUTH EVERY DAY, Disp: 90 capsule, Rfl: 1 .  medroxyPROGESTERone (DEPO-PROVERA) 150 MG/ML injection, Inject 150 mg into the muscle every 3 (three) months., Disp: , Rfl:  .  montelukast (SINGULAIR) 10 MG tablet, TAKE 1 TABLET BY MOUTH EVERYDAY AT BEDTIME, Disp: 90 tablet, Rfl: 3 .  Multiple Vitamin (MULTIVITAMIN WITH MINERALS) TABS tablet, Take 1 tablet by mouth daily., Disp: , Rfl:  .  promethazine (PHENERGAN) 12.5 MG tablet, Take 1 tablet (12.5 mg total) by mouth every 8 (eight) hours as needed for nausea or vomiting., Disp: 20 tablet, Rfl: 1 .  SUMAtriptan (IMITREX) 50 MG tablet, Take 1 tablet (50 mg total) by mouth every 2 (two) hours as needed for migraine. May repeat in 2 hours if headache persists or recurs., Disp: 10 tablet, Rfl: 1  Review of Systems  Social History   Tobacco Use  . Smoking status: Former Smoker    Types: Cigarettes    Quit date: 02/23/2019    Years since quitting: 0.7  . Smokeless tobacco: Never Used  Substance Use Topics  . Alcohol use: Never      Objective:   Temp (!) 96.6 F (35.9 C) (Temporal)   Wt 166 lb 11.2 oz (75.6 kg)   BMI 28.61 kg/m  Vitals:   11/18/19 1048  Temp: (!) 96.6 F (35.9 C)  TempSrc: Temporal  Weight: 166 lb 11.2 oz (75.6 kg)  Body mass index is 28.61  kg/m.   Physical Exam   No results found for any visits on 11/18/19.     Assessment & Plan    1. Encounter for surveillance of injectable contraceptive  - medroxyPROGESTERone (DEPO-PROVERA) injection 150 mg  F/u 3 months      Trinna Post, PA-C  [facility]

## 2019-12-02 ENCOUNTER — Telehealth: Payer: Self-pay

## 2019-12-02 DIAGNOSIS — Z91030 Bee allergy status: Secondary | ICD-10-CM

## 2019-12-02 MED ORDER — EPINEPHRINE 0.3 MG/0.3ML IJ SOAJ: 0.3000 mg | INTRAMUSCULAR | 0 refills | Status: DC | PRN

## 2019-12-02 NOTE — Telephone Encounter (Signed)
Copied from [name] [id number]. Topic: General - Inquiry >> Dec 02, 2019 12:31 PM Dominique Navarro wrote: Reason for CRM: Patient is requesting a epipen. Please advise

## 2019-12-02 NOTE — Telephone Encounter (Signed)
I contacted patient back to clarify need for Epi-Pen since it is not on patients active medication list, patient states that she has allergy to bees and that is why she is requesting pen. Patient ask that we [facility] [place].

## 2019-12-04 ENCOUNTER — Encounter: Payer: Self-pay | Admitting: Physician Assistant

## 2019-12-08 NOTE — Telephone Encounter (Signed)
Can we schedule her for nexplanon and/or IUD placement with Dr. Jacinto Reap. Thanks.

## 2019-12-12 NOTE — Progress Notes (Deleted)
    Established patient visit    Patient: Dominique Navarro   DOB: [id number]   29 y.o. Female  MRN: [id number] Visit Date: 12/12/2019  Today's healthcare provider: Lavon Paganini, MD   No chief complaint on file.  Subjective    HPI nexplanon insertion  {Show patient history (optional):23778::" "}   Medications: Outpatient Medications Prior to Visit  Medication Sig  . amitriptyline (ELAVIL) 25 MG tablet Take 1 tablet (25 mg total) by mouth at bedtime.  . DULoxetine (CYMBALTA) 20 MG capsule TAKE 1 CAPSULE BY MOUTH EVERY DAY  . EPINEPHrine 0.3 mg/0.3 mL IJ SOAJ injection Inject 0.3 mLs (0.3 mg total) into the muscle as needed for anaphylaxis.  . medroxyPROGESTERone (DEPO-PROVERA) 150 MG/ML injection Inject 150 mg into the muscle every 3 (three) months.  . montelukast (SINGULAIR) 10 MG tablet TAKE 1 TABLET BY MOUTH EVERYDAY AT BEDTIME  . Multiple Vitamin (MULTIVITAMIN WITH MINERALS) TABS tablet Take 1 tablet by mouth daily.  . promethazine (PHENERGAN) 12.5 MG tablet Take 1 tablet (12.5 mg total) by mouth every 8 (eight) hours as needed for nausea or vomiting.  . SUMAtriptan (IMITREX) 50 MG tablet Take 1 tablet (50 mg total) by mouth every 2 (two) hours as needed for migraine. May repeat in 2 hours if headache persists or recurs.   No facility-administered medications prior to visit.    Review of Systems  {Show previous labs (optional):23779::" "}   Objective    There were no vitals taken for this visit. {Show previous vital signs (optional):23777::" "}  Physical Exam  ***  No results found for any visits on 12/14/19.   Assessment & Plan    ***  No follow-ups on file.      {provider attestation***:1}   Lavon Paganini, MD  [facility] [phone] (phone) [phone] (fax)  [place]

## 2019-12-12 NOTE — Progress Notes (Signed)
Established patient visit    Patient: Dominique Navarro   DOB: [id number]   29 y.o. Female  MRN: [id number] Visit Date: 12/14/2019  Today's healthcare provider: Trinna Post, PA-C   Chief Complaint  Patient presents with  . Hand Pain  I,Caci Orren M Lamorris Knoblock,acting as a scribe for Trinna Post, PA-C.,have documented all relevant documentation on the behalf of Trinna Post, PA-C,as directed by  Trinna Post, PA-C while in the presence of Trinna Post, PA-C.  Subjective    Hand Pain  The incident occurred more than 1 week ago. There was no injury mechanism. The pain is present in the left hand, left fingers, left wrist, right hand, right wrist and right fingers. The quality of the pain is described as shooting, aching and burning. The pain does not radiate. The pain is at a severity of 0/10 (6-10 if moving or lifting per pt). The patient is experiencing no pain. The pain has been intermittent since the incident. Associated symptoms include numbness and tingling. Pertinent negatives include no muscle weakness. The symptoms are aggravated by movement and lifting. She has tried weight bearing and immobilization for the symptoms. The treatment provided mild relief.  Patient reports she wear wrist splints/brace at work due to the pain. She states she can still bend her wrist while wearing brace Patient reports pain while driving, writing, lifting boxes at work. She does not drop anything Patient states that she had a electromyography (EMG) on left hand about 7 years ago but does not remember which doctor did her test. She reports it was negative.        Medications: Outpatient Medications Prior to Visit  Medication Sig  . DULoxetine (CYMBALTA) 20 MG capsule TAKE 1 CAPSULE BY MOUTH EVERY DAY  . EPINEPHrine 0.3 mg/0.3 mL IJ SOAJ injection Inject 0.3 mLs (0.3 mg total) into the muscle as needed for anaphylaxis.  Marland Kitchen HYDROcodone-acetaminophen (NORCO/VICODIN) 5-325 MG tablet Take 1-2  tablets by mouth every 4 (four) hours as needed.  Marland Kitchen ibuprofen (ADVIL) 400 MG tablet Take 400 mg by mouth 3 (three) times daily as needed.  . medroxyPROGESTERone (DEPO-PROVERA) 150 MG/ML injection Inject 150 mg into the muscle every 3 (three) months.  . montelukast (SINGULAIR) 10 MG tablet TAKE 1 TABLET BY MOUTH EVERYDAY AT BEDTIME  . Multiple Vitamin (MULTIVITAMIN WITH MINERALS) TABS tablet Take 1 tablet by mouth daily.  . Multiple Vitamins-Minerals (MULTIVITAMIN WITH MINERALS) tablet Take by mouth.  . promethazine (PHENERGAN) 12.5 MG tablet Take 1 tablet (12.5 mg total) by mouth every 8 (eight) hours as needed for nausea or vomiting.  . SUMAtriptan (IMITREX) 50 MG tablet Take 1 tablet (50 mg total) by mouth every 2 (two) hours as needed for migraine. May repeat in 2 hours if headache persists or recurs.  . [DISCONTINUED] meloxicam (MOBIC) 7.5 MG tablet TAKE 1 TABLET BY MOUTH EVERY DAY AS NEEDED FOR ARTHRALGIA WITH FOOD  . amitriptyline (ELAVIL) 25 MG tablet Take 1 tablet (25 mg total) by mouth at bedtime.  . nortriptyline (PAMELOR) 25 MG capsule Take by mouth.   No facility-administered medications prior to visit.    Review of Systems  Neurological: Positive for tingling and numbness.        Objective    BP 116/70 (BP Location: Left Arm, Patient Position: Sitting, Cuff Size: Normal)   Pulse 95   Temp (!) 97.3 F (36.3 C) (Temporal)   Wt 162 lb 3.2 oz (73.6 kg)   SpO2 97%  BMI 27.84 kg/m     Physical Exam Constitutional:      Appearance: Normal appearance.  Musculoskeletal:     Right wrist: No tenderness. Normal range of motion.     Left wrist: No tenderness. Normal range of motion.     Right hand: No tenderness. Normal range of motion. Normal strength.     Left hand: No tenderness. Normal range of motion. Normal strength.     Comments: Phalen's test is positive bilaterally.   Skin:    General: Skin is warm and dry.  Neurological:     General: No focal deficit present.       Mental Status: She is alert and oriented to person, place, and time.  Psychiatric:        Mood and Affect: Mood normal.        Behavior: Behavior normal.       No results found for any visits on 12/14/19.   Assessment & Plan    1. Bilateral carpal tunnel syndrome Patient was advised to wear cock up wrist brace/ splint at bedtime. Advised on stepwise course of treatment. Meloxicam as below and referral to orthopedic surgery.   - meloxicam (MOBIC) 15 MG tablet; Take 1 tablet (15 mg total) by mouth daily.  Dispense: 30 tablet; Refill: 0     Return in about 8 months (around 08/14/2020) for CPE.      ITrinna Post, PA-C, have reviewed all documentation for this visit. The documentation on 12/14/19 for the exam, diagnosis, procedures, and orders are all accurate and complete.    Paulene Floor  [facility] [phone] (phone) [phone] (fax)  [place]

## 2019-12-14 ENCOUNTER — Ambulatory Visit: Payer: Medicaid Other | Admitting: Family Medicine

## 2019-12-14 ENCOUNTER — Other Ambulatory Visit: Payer: Self-pay

## 2019-12-14 ENCOUNTER — Encounter: Payer: Self-pay | Admitting: Physician Assistant

## 2019-12-14 ENCOUNTER — Ambulatory Visit (INDEPENDENT_AMBULATORY_CARE_PROVIDER_SITE_OTHER): Payer: Medicaid Other | Admitting: Physician Assistant

## 2019-12-14 VITALS — BP 116/70 | HR 95 | Temp 97.3°F | Wt 162.2 lb

## 2019-12-14 DIAGNOSIS — G5603 Carpal tunnel syndrome, bilateral upper limbs: Secondary | ICD-10-CM

## 2019-12-14 MED ORDER — MELOXICAM 15 MG PO TABS: 15.0000 mg | ORAL_TABLET | Freq: Every day | ORAL | 0 refills | Status: DC

## 2019-12-14 NOTE — Patient Instructions (Signed)
Wear cockup wrist splint at bedtime.

## 2019-12-16 NOTE — Patient Instructions (Signed)
Nexplanon Instructions After Insertion  Keep bandage clean and dry for 24 hours  May use ice/Tylenol/Ibuprofen for soreness or pain  If you develop fever, drainage or increased warmth from incision site-contact office immediately   

## 2019-12-19 ENCOUNTER — Ambulatory Visit: Payer: Medicaid Other | Admitting: Family Medicine

## 2019-12-19 ENCOUNTER — Other Ambulatory Visit: Payer: Self-pay

## 2019-12-19 ENCOUNTER — Encounter: Payer: Self-pay | Admitting: Family Medicine

## 2019-12-19 VITALS — BP 110/71 | HR 110 | Temp 97.3°F | Wt 165.0 lb

## 2019-12-19 DIAGNOSIS — Z30017 Encounter for initial prescription of implantable subdermal contraceptive: Secondary | ICD-10-CM

## 2019-12-19 NOTE — Progress Notes (Signed)
I,Laura E Walsh,acting as a scribe for Lavon Paganini, MD.,have documented all relevant documentation on the behalf of Lavon Paganini, MD,as directed by  Lavon Paganini, MD while in the presence of Lavon Paganini, MD.   Established patient visit   Patient: Dominique Navarro   DOB: [id number]   29 y.o. Female  MRN: [id number] Visit Date: 12/19/2019  Today's healthcare provider: Lavon Paganini, MD   Chief Complaint  Patient presents with  . Contraception   Subjective    HPI  Patient currently on Depo Provera for contraception.  Desires switch to LARC as she is concerned that Depo is contributing to her frequent headaches.  Not planning another pregnancy      Medications: Outpatient Medications Prior to Visit  Medication Sig  . DULoxetine (CYMBALTA) 20 MG capsule TAKE 1 CAPSULE BY MOUTH EVERY DAY  . EPINEPHrine 0.3 mg/0.3 mL IJ SOAJ injection Inject 0.3 mLs (0.3 mg total) into the muscle as needed for anaphylaxis.  Marland Kitchen meloxicam (MOBIC) 15 MG tablet Take 1 tablet (15 mg total) by mouth daily.  . montelukast (SINGULAIR) 10 MG tablet TAKE 1 TABLET BY MOUTH EVERYDAY AT BEDTIME  . Multiple Vitamin (MULTIVITAMIN WITH MINERALS) TABS tablet Take 1 tablet by mouth daily.  . Multiple Vitamins-Minerals (MULTIVITAMIN WITH MINERALS) tablet Take by mouth.  . nortriptyline (PAMELOR) 25 MG capsule Take by mouth.  . promethazine (PHENERGAN) 12.5 MG tablet Take 1 tablet (12.5 mg total) by mouth every 8 (eight) hours as needed for nausea or vomiting.  . SUMAtriptan (IMITREX) 50 MG tablet Take 1 tablet (50 mg total) by mouth every 2 (two) hours as needed for migraine. May repeat in 2 hours if headache persists or recurs.  Marland Kitchen amitriptyline (ELAVIL) 25 MG tablet Take 1 tablet (25 mg total) by mouth at bedtime.  Marland Kitchen HYDROcodone-acetaminophen (NORCO/VICODIN) 5-325 MG tablet Take 1-2 tablets by mouth every 4 (four) hours as needed.  Marland Kitchen ibuprofen (ADVIL) 400 MG tablet Take 400 mg by mouth 3 (three)  times daily as needed.  . medroxyPROGESTERone (DEPO-PROVERA) 150 MG/ML injection Inject 150 mg into the muscle every 3 (three) months.   No facility-administered medications prior to visit.    Review of Systems     Objective    BP 110/71 (BP Location: Left Arm, Patient Position: Sitting, Cuff Size: Normal)   Pulse (!) 110   Temp (!) 97.3 F (36.3 C) (Temporal)   Wt 165 lb (74.8 kg)   BMI 28.32 kg/m    Physical Exam Constitutional:      General: She is not in acute distress.    Appearance: Normal appearance. She is not diaphoretic.  Pulmonary:     Effort: Pulmonary effort is normal. No respiratory distress.  Neurological:     Mental Status: She is alert and oriented to person, place, and time.  Psychiatric:        Mood and Affect: Mood normal.        Behavior: Behavior normal.      No results found for any visits on 12/19/19.  Assessment & Plan    1. Encounter for initial prescription of Nexplanon  Patient given informed consent, signed copy in the chart, time out was performed. Pregnancy test was negative. Appropriate time out taken.  Patient's left arm was prepped and draped in the usual sterile fashion.  Insertion site was approximated about 4 cm proximal to the elbow.  Pt was prepped with alcohol swab and then injected with 3cc of 1% lidocaine with epinephrine.  Pt  was prepped with betadine, Nexplanon removed form packaging,  Device confirmed in needle, then inserted full length of needle and withdrawn per handbook instructions.  Pt insertion site covered with steri-strip and pressure dressing.   Minimal blood loss.  Pt tolerated the procedure well.   Return if symptoms worsen or fail to improve.      I, Lavon Paganini, MD, have reviewed all documentation for this visit. The documentation on 12/19/19 for the exam, diagnosis, procedures, and orders are all accurate and complete.   Gracia Saggese, Dionne Bucy, MD, MPH [facility]

## 2020-01-07 ENCOUNTER — Emergency Department
Admission: EM | Admit: 2020-01-07 | Discharge: 2020-01-07 | Disposition: A | Discharge status: Home / Self Care | Payer: Medicaid Other | Attending: Emergency Medicine | Admitting: Emergency Medicine

## 2020-01-07 ENCOUNTER — Other Ambulatory Visit: Payer: Self-pay

## 2020-01-07 DIAGNOSIS — G43009 Migraine without aura, not intractable, without status migrainosus: Secondary | ICD-10-CM | POA: Diagnosis not present

## 2020-01-07 DIAGNOSIS — Z79899 Other long term (current) drug therapy: Secondary | ICD-10-CM | POA: Diagnosis not present

## 2020-01-07 DIAGNOSIS — Z87891 Personal history of nicotine dependence: Secondary | ICD-10-CM | POA: Insufficient documentation

## 2020-01-07 DIAGNOSIS — R519 Headache, unspecified: Secondary | ICD-10-CM | POA: Diagnosis present

## 2020-01-07 MED ORDER — METOCLOPRAMIDE HCL 10 MG PO TABS
10.0000 mg | ORAL_TABLET | Freq: Once | ORAL | Status: AC
  Administered 2020-01-07: 10 mg via ORAL
  Filled 2020-01-07: qty 1

## 2020-01-07 MED ORDER — KETOROLAC TROMETHAMINE 10 MG PO TABS: 10.0000 mg | ORAL_TABLET | Freq: Three times a day (TID) | ORAL | 0 refills | Status: DC

## 2020-01-07 MED ORDER — KETOROLAC TROMETHAMINE 30 MG/ML IJ SOLN
30.0000 mg | Freq: Once | INTRAMUSCULAR | Status: AC
  Administered 2020-01-07: 30 mg via INTRAMUSCULAR
  Filled 2020-01-07: qty 1

## 2020-01-07 MED ORDER — CYCLOBENZAPRINE HCL 10 MG PO TABS
10.0000 mg | ORAL_TABLET | Freq: Once | ORAL | Status: AC
  Administered 2020-01-07: 10 mg via ORAL
  Filled 2020-01-07: qty 1

## 2020-01-07 NOTE — ED Notes (Signed)
Pt states she went to bed yesterday without taking her medication and woke up with a migraine that got progressively worse throughout the day. See triage note- pt states she took meds at home without relief. Pt c/o of blurry vision that "comes in and out of focus." Pt reports she is seen by a neurologist for migraines.

## 2020-01-07 NOTE — Discharge Instructions (Addendum)
Take the prescription meds as directed. Follow-up with your provider or return as needed.

## 2020-01-07 NOTE — ED Provider Notes (Signed)
[facility] Emergency Department Provider Note ____________________________________________  Time seen: 2137  I have reviewed the triage vital signs and the nursing notes.  HISTORY  Chief Complaint  Headache  HPI Dominique Navarro is a 29 y.o. female presents herself to the ED for evaluation of a persistent migraine headache.  Patient with a history of the same, describes pain that has not responded to her typical migraine cocktail.  She describes onset last night, without preceding prodrome or  aura.  She reports nausea and light sensitivity.  She has taken to sumatriptan hands, a total of 400 mg of ibuprofen 25 mg of Benadryl, and 1000 mg of Tylenol about an hour prior to arrival.  She presented with pain rated at a 9 out of 10, at the time of this evaluation, she reports improvement to a 5 out of 10 currently.  She denies any head injury, vision loss, weakness, or syncope.  Past Medical History:  Diagnosis Date  . Depression   . Migraine     Patient Active Problem List   Diagnosis Date Noted  . 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 Arthralgia 06/06/2019  . Osteoporosis screening 06/06/2019  . Other insomnia 06/06/2019  . Migraine headache 05/14/2018  . Advance directive discussed with patient 09/02/2013  . Depression complicating pregnancy, antepartum 04/29/2013  . FHx: epilepsy 04/29/2013  . Medication exposure during first trimester of pregnancy 04/29/2013    Past Surgical History:  Procedure Laterality Date  . CYST REMOVAL NECK      Prior to Admission medications   Medication Sig Start Date End Date Taking? Authorizing Provider  etonogestrel (NEXPLANON) 68 MG IMPL implant 1 each by Subdermal route once. 12/08/19  Yes [provider]  DULoxetine (CYMBALTA) 20 MG capsule TAKE 1 CAPSULE BY MOUTH EVERY DAY 11/14/19   Trinna Post, PA-C  EPINEPHrine 0.3 mg/0.3 mL IJ SOAJ injection Inject 0.3 mLs (0.3 mg total)  into the muscle as needed for anaphylaxis. 12/02/19   Trinna Post, PA-C  ketorolac (TORADOL) 10 MG tablet Take 1 tablet (10 mg total) by mouth every 8 (eight) hours. 01/07/20   Nawaf Strange, Dannielle Karvonen, PA-C  meloxicam (MOBIC) 15 MG tablet Take 1 tablet (15 mg total) by mouth daily. 12/14/19   Trinna Post, PA-C  montelukast (SINGULAIR) 10 MG tablet TAKE 1 TABLET BY MOUTH EVERYDAY AT BEDTIME 11/14/19   Trinna Post, PA-C  Multiple Vitamin (MULTIVITAMIN WITH MINERALS) TABS tablet Take 1 tablet by mouth daily.    [provider]  Multiple Vitamins-Minerals (MULTIVITAMIN WITH MINERALS) tablet Take by mouth.    [provider]  nortriptyline (PAMELOR) 25 MG capsule Take by mouth. 09/16/19   [provider]  promethazine (PHENERGAN) 12.5 MG tablet Take 1 tablet (12.5 mg total) by mouth every 8 (eight) hours as needed for nausea or vomiting. 08/31/19   Trinna Post, PA-C  SUMAtriptan (IMITREX) 50 MG tablet Take 1 tablet (50 mg total) by mouth every 2 (two) hours as needed for migraine. May repeat in 2 hours if headache persists or recurs. 07/12/19   Trinna Post, PA-C  amitriptyline (ELAVIL) 25 MG tablet Take 1 tablet (25 mg total) by mouth at bedtime. 08/31/19 01/07/20  Trinna Post, PA-C    Allergies Sulfur, Bee venom, and Sulfa antibiotics  Family History  Problem Relation Age of Onset  . Asthma Mother   . COPD Mother   . COPD Father   . Seizures Son  Social History Social History   Tobacco Use  . Smoking status: Former Smoker    Types: Cigarettes    Quit date: 02/23/2019    Years since quitting: 0.8  . Smokeless tobacco: Never Used  Substance Use Topics  . Alcohol use: Never  . Drug use: Never    Review of Systems  Constitutional: Negative for fever. Eyes: Negative for visual changes. ENT: Negative for sore throat. Respiratory: Negative for shortness of breath. Gastrointestinal: Negative for abdominal pain, vomiting and  diarrhea. Musculoskeletal: Negative for back pain. Skin: Negative for rash. Neurological: Positive for headaches.  Denies focal weakness or numbness. ____________________________________________  PHYSICAL EXAM:  VITAL SIGNS: ED Triage Vitals  Enc Vitals Group     BP 01/07/20 2006 (!) 136/93     Pulse Rate 01/07/20 2006 93     Resp 01/07/20 2006 18     Temp 01/07/20 2006 98.1 F (36.7 C)     Temp src --      SpO2 01/07/20 2006 100 %     Weight 01/07/20 2007 162 lb (73.5 kg)     Height 01/07/20 2007 5\' 4"  (1.626 m)     Head Circumference --      Peak Flow --      Pain Score 01/07/20 2006 9     Pain Loc --      Pain Edu? --      Excl. in [place]? --     Constitutional: Alert and oriented. Well appearing and in no distress.  GCS = 15 Head: Normocephalic and atraumatic. Eyes: Conjunctivae are normal. PERRL. Normal extraocular movements and fundi bilaterally. Ears: Canals clear. TMs intact bilaterally. Nose: No congestion/rhinorrhea/epistaxis. Mouth/Throat: Mucous membranes are moist. Neck: Supple.  No midline tenderness, no nuchal rigidity. Cardiovascular: Normal rate, regular rhythm. Normal distal pulses. Respiratory: Normal respiratory effort. No wheezes/rales/rhonchi. Musculoskeletal: Nontender with normal range of motion in all extremities.  Neurologic: Cranial nerves II through XII grossly intact.  Normal gait without ataxia. Normal speech and language. No gross focal neurologic deficits are appreciated. Skin:  Skin is warm, dry and intact. No rash noted. Psychiatric: Mood and affect are normal. Patient exhibits appropriate insight and judgment. ____________________________________________  PROCEDURES  Ketorolac 30 mg IM Metoclopramide 10 mg p.o. Cyclobenzaprine 10 mg p.o.  Procedures ____________________________________________  INITIAL IMPRESSION / ASSESSMENT AND PLAN / ED COURSE  Patient with ED evaluation of persistent migraine without aura.  Patient clinical  picture is benign and stable at this time.  No acute intracranial process is suspected.  No indication of cerebellar ataxia or neuromuscular deficit.  Patient has responded well to ED medication administration, and reports her headache pain is improved to 1/10.  She will be discharged with a prescription for ketorolac to take orally as directed.  Should continue with her previous home medications and follow-up with primary provider.  Return precautions have been reviewed.  Gurnoor Lahn was evaluated in Emergency Department on 01/07/2020 for the symptoms described in the history of present illness. She was evaluated in the context of the global COVID-19 pandemic, which necessitated consideration that the patient might be at risk for infection with the SARS-CoV-2 virus that causes COVID-19. Institutional protocols and algorithms that pertain to the evaluation of patients at risk for COVID-19 are in a state of rapid change based on information released by regulatory bodies including the CDC and federal and state organizations. These policies and algorithms were followed during the patient's care in th ____________________________________________  FINAL CLINICAL IMPRESSION(S) / ED DIAGNOSES  Final diagnoses:  Migraine without aura and without status migrainosus, not intractable      Jakota Manthei, Dannielle Karvonen, PA-C 01/07/20 2257    Drenda Freeze, MD 01/08/20 [phone]

## 2020-01-07 NOTE — ED Triage Notes (Signed)
Patient c/o migraine, nausea, light sensitivity. Hx of same . Patient has take 2 sumatriptan, 2 ibuprofen (1 hour ago), 1 benadryl (1 hour ago), 2 500mg  tylenol (1 hour ago), with no relief of symptoms.

## 2020-01-11 ENCOUNTER — Other Ambulatory Visit: Payer: Self-pay | Admitting: Physician Assistant

## 2020-01-11 DIAGNOSIS — G5603 Carpal tunnel syndrome, bilateral upper limbs: Secondary | ICD-10-CM

## 2020-01-11 NOTE — Telephone Encounter (Signed)
Requested medication (s) are due for refill today: yes  Requested medication (s) are on the active medication list: yes  Last refill:  12/14/19 #30  0 refills  Future visit scheduled: No  Notes to clinic: Dx code needed    Requested Prescriptions  Pending Prescriptions Disp Refills   meloxicam (MOBIC) 15 MG tablet [Pharmacy Med Name: MELOXICAM 15 MG TABLET] 30 tablet 0    Sig: TAKE 1 TABLET BY MOUTH EVERY DAY      Analgesics:  COX2 Inhibitors Passed - 01/11/2020 12:20 PM      Passed - HGB in normal range and within 360 days    Hemoglobin  Date Value Ref Range Status  05/19/2019 13.8 11.1 - 15.9 g/dL Final          Passed - Cr in normal range and within 360 days    Creatinine, Ser  Date Value Ref Range Status  05/19/2019 0.94 0.57 - 1.00 mg/dL Final          Passed - Patient is not pregnant      Passed - Valid encounter within last 12 months    Recent Outpatient Visits           3 weeks ago Encounter for initial prescription of [place], Dionne Bucy, MD   4 weeks ago Bilateral carpal tunnel syndrome   [place], [place], [place]   4 months ago Other migraine without status migrainosus, not intractable   [place], [place]   5 months ago Acute non-recurrent frontal sinusitis   [facility] [place], Fabio Bering M, [place]   5 months ago No-show for appointment   [place], Wendee Beavers, [place]

## 2020-01-17 ENCOUNTER — Other Ambulatory Visit: Payer: Self-pay | Admitting: Physician Assistant

## 2020-01-17 DIAGNOSIS — G43009 Migraine without aura, not intractable, without status migrainosus: Secondary | ICD-10-CM

## 2020-02-08 ENCOUNTER — Ambulatory Visit: Payer: Medicaid Other | Admitting: Physician Assistant

## 2020-02-09 ENCOUNTER — Other Ambulatory Visit: Payer: Self-pay

## 2020-02-09 ENCOUNTER — Ambulatory Visit: Payer: Medicaid Other | Admitting: Physician Assistant

## 2020-02-09 ENCOUNTER — Encounter: Payer: Self-pay | Admitting: Physician Assistant

## 2020-02-09 VITALS — BP 104/75 | HR 114 | Temp 97.1°F | Resp 16 | Ht 64.0 in | Wt 165.0 lb

## 2020-02-09 DIAGNOSIS — R9431 Abnormal electrocardiogram [ECG] [EKG]: Secondary | ICD-10-CM | POA: Diagnosis not present

## 2020-02-09 DIAGNOSIS — R000 Tachycardia, unspecified: Secondary | ICD-10-CM | POA: Diagnosis not present

## 2020-02-09 DIAGNOSIS — Z3046 Encounter for surveillance of implantable subdermal contraceptive: Secondary | ICD-10-CM | POA: Diagnosis not present

## 2020-02-09 DIAGNOSIS — R002 Palpitations: Secondary | ICD-10-CM

## 2020-02-09 MED ORDER — METOPROLOL SUCCINATE ER 25 MG PO TB24: 25.0000 mg | ORAL_TABLET | Freq: Every day | ORAL | 3 refills | Status: DC

## 2020-02-09 NOTE — Patient Instructions (Addendum)
Sinus Tachycardia  Sinus tachycardia is a kind of fast heartbeat. In sinus tachycardia, the heart beats more than 100 times a minute. Sinus tachycardia starts in a part of the heart called the sinus node. Sinus tachycardia may be harmless, or it may be a sign of a serious condition. What are the causes? This condition may be caused by:  Exercise or exertion.  A fever.  Pain.  Loss of body fluids (dehydration).  Severe bleeding (hemorrhage).  Anxiety and stress.  Certain substances, including: ? Alcohol. ? Caffeine. ? Tobacco and nicotine products. ? Cold medicines. ? Illegal drugs.  Medical conditions including: ? Heart disease. ? An infection. ? An overactive thyroid (hyperthyroidism). ? A lack of red blood cells (anemia). What are the signs or symptoms? Symptoms of this condition include:  A feeling that the heart is beating quickly (palpitations).  Suddenly noticing your heartbeat (cardiac awareness).  Dizziness.  Tiredness (fatigue).  Shortness of breath.  Chest pain.  Nausea.  Fainting. How is this diagnosed? This condition is diagnosed with:  A physical exam.  Other tests, such as: ? Blood tests. ? An electrocardiogram (ECG). This test measures the electrical activity of the heart. ? Ambulatory cardiac monitor. This records your heartbeats for 24 hours or more. You may be referred to a heart specialist (cardiologist). How is this treated? Treatment for this condition depends on the cause or the underlying condition. Treatment may involve:  Treating the underlying condition.  Taking new medicines or changing your current medicines as told by your health care provider.  Making changes to your diet or lifestyle. Follow these instructions at home: Lifestyle   Do not use any products that contain nicotine or tobacco, such as cigarettes and e-cigarettes. If you need help quitting, ask your health care provider.  Do not use illegal drugs, such as  cocaine.  Learn relaxation methods to help you when you get stressed or anxious. These include deep breathing.  Avoid caffeine or other stimulants. Alcohol use   Do not drink alcohol if: ? Your health care provider tells you not to drink. ? You are pregnant, may be pregnant, or are planning to become pregnant.  If you drink alcohol, limit how much you have: ? 0-1 drink a day for women. ? 0-2 drinks a day for men.  Be aware of how much alcohol is in your drink. In the [place] one drink equals one typical bottle of beer (12 oz), one-half glass of wine (5 oz), or one shot of hard liquor (1 oz). General instructions  Drink enough fluids to keep your urine pale yellow.  Take over-the-counter and prescription medicines only as told by your health care provider.  Keep all follow-up visits as told by your health care provider. This is important. Contact a health care provider if you have:  A fever.  Vomiting or diarrhea that does not go away. Get help right away if you:  Have pain in your chest, upper arms, jaw, or neck.  Become weak or dizzy.  Feel faint.  Have palpitations that do not go away. Summary  In sinus tachycardia, the heart beats more than 100 times a minute.  Sinus tachycardia may be harmless, or it may be a sign of a serious condition.  Treatment for this condition depends on the cause or the underlying condition.  Get help right away if you have pain in your chest, upper arms, jaw, or neck. This information is not intended to replace advice given to you by   your health care provider. Make sure you discuss any questions you have with your health care provider. Document Revised: 09/30/2017 Document Reviewed: 09/30/2017 Elsevier Patient Education  [place], Adult Taking care of your wound properly can help to prevent pain, infection, and scarring. It can also help your wound to heal more quickly. How to care for your wound Wound  care      Follow instructions from your health care provider about how to take care of your wound. Make sure you: ? Wash your hands with soap and water before you change the bandage (dressing). If soap and water are not available, use hand sanitizer. ? Change your dressing as told by your health care provider. ? Leave stitches (sutures), skin glue, or adhesive strips in place. These skin closures may need to stay in place for 2 weeks or longer. If adhesive strip edges start to loosen and curl up, you may trim the loose edges. Do not remove adhesive strips completely unless your health care provider tells you to do that.  Check your wound area every day for signs of infection. Check for: ? Redness, swelling, or pain. ? Fluid or blood. ? Warmth. ? Pus or a bad smell.  Ask your health care provider if you should clean the wound with mild soap and water. Doing this may include: ? Using a clean towel to pat the wound dry after cleaning it. Do not rub or scrub the wound. ? Applying a cream or ointment. Do this only as told by your health care provider. ? Covering the incision with a clean dressing.  Ask your health care provider when you can leave the wound uncovered.  Keep the dressing dry until your health care provider says it can be removed. Do not take baths, swim, use a hot tub, or do anything that would put the wound underwater until your health care provider approves. Ask your health care provider if you can take showers. You may only be allowed to take sponge baths. Medicines   If you were prescribed an antibiotic medicine, cream, or ointment, take or use the antibiotic as told by your health care provider. Do not stop taking or using the antibiotic even if your condition improves.  Take over-the-counter and prescription medicines only as told by your health care provider. If you were prescribed pain medicine, take it 30 or more minutes before you do any wound care or as told by your  health care provider. General instructions  Return to your normal activities as told by your health care provider. Ask your health care provider what activities are safe.  Do not scratch or pick at the wound.  Do not use any products that contain nicotine or tobacco, such as cigarettes and e-cigarettes. These may delay wound healing. If you need help quitting, ask your health care provider.  Keep all follow-up visits as told by your health care provider. This is important.  Eat a diet that includes protein, vitamin A, vitamin C, and other nutrient-rich foods to help the wound heal. ? Foods rich in protein include meat, dairy, beans, nuts, and other sources. ? Foods rich in vitamin A include carrots and dark green, leafy vegetables. ? Foods rich in vitamin C include citrus, tomatoes, and other fruits and vegetables. ? Nutrient-rich foods have protein, carbohydrates, fat, vitamins, or minerals. Eat a variety of healthy foods including vegetables, fruits, and whole grains. Contact a health care provider if:  You received a tetanus  shot and you have swelling, severe pain, redness, or bleeding at the injection site.  Your pain is not controlled with medicine.  You have redness, swelling, or pain around the wound.  You have fluid or blood coming from the wound.  Your wound feels warm to the touch.  You have pus or a bad smell coming from the wound.  You have a fever or chills.  You are nauseous or you vomit.  You are dizzy. Get help right away if:  You have a red streak going away from your wound.  The edges of the wound open up and separate.  Your wound is bleeding, and the bleeding does not stop with gentle pressure.  You have a rash.  You faint.  You have trouble breathing. Summary  Always wash your hands with soap and water before changing your bandage (dressing).  To help with healing, eat foods that are rich in protein, vitamin A, vitamin C, and other  nutrients.  Check your wound every day for signs of infection. Contact your health care provider if you suspect that your wound is infected. This information is not intended to replace advice given to you by your health care provider. Make sure you discuss any questions you have with your health care provider. Document Revised: 11/29/2018 Document Reviewed: 02/26/2016 Elsevier Patient Education  [place].

## 2020-02-09 NOTE — Progress Notes (Signed)
Established patient visit   Patient: Dominique Navarro   DOB: [birth year]   29 y.o. Female  MRN: [id number] Visit Date: 02/09/2020  Today's healthcare provider: Mar Daring, PA-C   Chief Complaint  Patient presents with  . Tachycardia   Subjective    HPI Patient here with c/o elevated pulse. Reports that she has been donating plasma and she was there on Monday and they noticed that her pulse was high and she went back yesterday and it was high again and today. Reports that today it was 112 and yesterday it was 109. She reports that if Topamax can be causing this. Reports that she has cut back on her coffee. Also feels her headaches are worse due to the [place]. She would like nexplanon removed if possible and then may try to decrease her topamax if her headaches will lessen without any hormones.  Patient Active Problem List   Diagnosis Date Noted  . 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 Arthralgia 06/06/2019  . Osteoporosis screening 06/06/2019  . Other insomnia 06/06/2019  . Migraine headache 05/14/2018  . Advance directive discussed with patient 09/02/2013  . Depression complicating pregnancy, antepartum 04/29/2013  . FHx: epilepsy 04/29/2013  . Medication exposure during first trimester of pregnancy 04/29/2013   Past Medical History:  Diagnosis Date  . Depression   . Migraine        Medications: Outpatient Medications Prior to Visit  Medication Sig  . DULoxetine (CYMBALTA) 20 MG capsule TAKE 1 CAPSULE BY MOUTH EVERY DAY  . EPINEPHrine 0.3 mg/0.3 mL IJ SOAJ injection Inject 0.3 mLs (0.3 mg total) into the muscle as needed for anaphylaxis.  Marland Kitchen etonogestrel (NEXPLANON) 68 MG IMPL implant 1 each by Subdermal route once.  Marland Kitchen ketorolac (TORADOL) 10 MG tablet Take 1 tablet (10 mg total) by mouth every 8 (eight) hours.  . meloxicam (MOBIC) 15 MG tablet TAKE 1 TABLET BY MOUTH EVERY DAY  . montelukast (SINGULAIR) 10 MG tablet TAKE 1 TABLET  BY MOUTH EVERYDAY AT BEDTIME  . Multiple Vitamin (MULTIVITAMIN WITH MINERALS) TABS tablet Take 1 tablet by mouth daily.  . Multiple Vitamins-Minerals (MULTIVITAMIN WITH MINERALS) tablet Take by mouth.  . nortriptyline (PAMELOR) 25 MG capsule Take by mouth.  . promethazine (PHENERGAN) 12.5 MG tablet Take 1 tablet (12.5 mg total) by mouth every 8 (eight) hours as needed for nausea or vomiting.  . SUMAtriptan (IMITREX) 50 MG tablet TAKE 1 TABLET BY MOUTH EVERY 2 HOURS AS NEEDED FOR MIGRAINE. MAY REPEAT IN 2 HOURS IF HEADACHE PERSISTS OR RECURS.   No facility-administered medications prior to visit.    Review of Systems  Constitutional: Negative for chills and fatigue.  Eyes: Negative for visual disturbance.  Respiratory: Negative for cough, chest tightness, shortness of breath and wheezing.   Cardiovascular: Positive for palpitations. Negative for chest pain and leg swelling.  Neurological: Positive for headaches. Negative for dizziness, syncope and weakness.    Last CBC Lab Results  Component Value Date   WBC 5.7 05/19/2019   HGB 13.8 05/19/2019   HCT 40.6 05/19/2019   MCV 83 05/19/2019   MCH 28.1 05/19/2019   RDW 12.4 05/19/2019   PLT 241 51/70/0174   Last metabolic panel Lab Results  Component Value Date   GLUCOSE 84 05/19/2019   NA 141 05/19/2019   K 4.0 05/19/2019   CL 108 (H) 05/19/2019   CO2 18 (L) 05/19/2019   BUN 12 05/19/2019   CREATININE 0.94 05/19/2019  GFRNONAA 82 05/19/2019   GFRAA 95 05/19/2019   CALCIUM 9.3 05/19/2019   PROT 7.2 05/19/2019   ALBUMIN 5.2 (H) 05/19/2019   LABGLOB 2.0 05/19/2019   AGRATIO 2.6 (H) 05/19/2019   BILITOT 0.4 05/19/2019   ALKPHOS 60 05/19/2019   AST 21 05/19/2019   ALT 22 05/19/2019      Objective    BP 104/75 (BP Location: Left Arm, Patient Position: Sitting, Cuff Size: Large)   Pulse (!) 114   Temp (!) 97.1 F (36.2 C) (Temporal)   Resp 16   Ht 5\' 4"  (1.626 m)   Wt 165 lb (74.8 kg)   BMI 28.32 kg/m  BP Readings  from Last 3 Encounters:  02/09/20 104/75  01/07/20 (!) 149/72  12/19/19 110/71   Wt Readings from Last 3 Encounters:  02/09/20 165 lb (74.8 kg)  01/07/20 162 lb (73.5 kg)  12/19/19 165 lb (74.8 kg)      Physical Exam Vitals reviewed.  Constitutional:      General: She is not in acute distress.    Appearance: Normal appearance. She is well-developed. She is not ill-appearing or diaphoretic.  Neck:     Thyroid: No thyromegaly.     Vascular: No carotid bruit or JVD.     Trachea: No tracheal deviation.  Cardiovascular:     Rate and Rhythm: Regular rhythm. Tachycardia present.     Pulses: Normal pulses.     Heart sounds: Normal heart sounds. No murmur heard.  No friction rub. No gallop.   Pulmonary:     Effort: Pulmonary effort is normal. No respiratory distress.     Breath sounds: Normal breath sounds. No wheezing or rales.  Musculoskeletal:     Cervical back: Normal range of motion and neck supple.  Lymphadenopathy:     Cervical: No cervical adenopathy.  Skin:    Capillary Refill: Capillary refill takes less than 2 seconds.  Neurological:     General: No focal deficit present.     Mental Status: She is alert.  Psychiatric:        Mood and Affect: Mood normal.      No results found for any visits on 02/09/20.  Assessment & Plan     1. Rapid palpitations EKG today showed sinus tachycardia with a borderline shortened PR segment at 114 msec. No signs of arrhythmia or ST segment changes. Discussed possible medication sources, specifically topamax as it correlates with timing of tachycardia. Patient declines wanting to stop medication at this time due to her headaches. Could also be from combination of duloxetine and nortriptyline. Patient declines any medication changes at this time and desires Nexplanon removal to see if that would lessen her migraines and then she could stop topamax after that. Will start Metoprolol as below to decrease heart strain and referral to  cardiology also placed.  - EKG 12-Lead  2. Sinus tachycardia See above medical treatment plan. - metoprolol succinate (TOPROL-XL) 25 MG 24 hr tablet; Take 1 tablet (25 mg total) by mouth daily.  Dispense: 90 tablet; Refill: 3 - Ambulatory referral to Cardiology  3. Shortened PR interval See above medical treatment plan. - Ambulatory referral to Cardiology  4. Nexplanon removal See procedure note below.  Procedure Note: Procedure, benefits and risks (including bleeding, infection and allergic reaction) and alternatives were explained to the patient. All questions were sought and answered. Verbal consent was obtained.  The area was then prepped with betadine swabs and draped in a sterile fashion. Local anesthetic was administered  with 1.5cc of 2% xylocaine with epinephrine. An small incision was then made using a size 10 scalpel blade. Nexplanon was attempted to be removed but could not locate tip with forceps. Procedure was aborted. Wound was cleaned and covered with a dry dressing. Wound cleansing instructions and signs of infection were verbally given to the patient.  Patient will return in 2-4 weeks with Dr. Brita Romp for removal.    No follow-ups on file.      Reynolds Bowl, PA-C, have reviewed all documentation for this visit. The documentation on 02/10/20 for the exam, diagnosis, procedures, and orders are all accurate and complete.    Rubye Beach  [facility] [phone] (phone) [phone] (fax)  [place]

## 2020-02-17 ENCOUNTER — Other Ambulatory Visit: Payer: Self-pay | Admitting: Physician Assistant

## 2020-02-17 DIAGNOSIS — G5603 Carpal tunnel syndrome, bilateral upper limbs: Secondary | ICD-10-CM

## 2020-02-17 NOTE — Telephone Encounter (Signed)
Requested Prescriptions  Pending Prescriptions Disp Refills  . meloxicam (MOBIC) 15 MG tablet [Pharmacy Med Name: MELOXICAM 15 MG TABLET] 90 tablet 2    Sig: TAKE 1 TABLET BY MOUTH EVERY DAY     Analgesics:  COX2 Inhibitors Passed - 02/17/2020  2:05 PM      Passed - HGB in normal range and within 360 days    Hemoglobin  Date Value Ref Range Status  05/19/2019 13.8 11.1 - 15.9 g/dL Final         Passed - Cr in normal range and within 360 days    Creatinine, Ser  Date Value Ref Range Status  05/19/2019 0.94 0.57 - 1.00 mg/dL Final         Passed - Patient is not pregnant      Passed - Valid encounter within last 12 months    Recent Outpatient Visits          1 week ago Rapid palpitations   [place], [place]   2 months ago Encounter for initial prescription of [place], Dionne Bucy, MD   2 months ago Bilateral carpal tunnel syndrome   [place], [place], PA-C   5 months ago Other migraine without status migrainosus, not intractable   [place], [place], [place]   6 months ago Acute non-recurrent frontal sinusitis   [facility] [place], [place]      Future Appointments            In 2 weeks Ag[place]Etang, Aaron Edelman, MD [facility], [place]   In 5 months [place], [place], [place]

## 2020-02-20 ENCOUNTER — Ambulatory Visit: Payer: Self-pay | Admitting: *Deleted

## 2020-02-20 ENCOUNTER — Ambulatory Visit: Payer: Medicaid Other | Admitting: Cardiology

## 2020-02-20 NOTE — Telephone Encounter (Signed)
I do not think it's a side effect of the medicine. If the sweating is her only symptom and the medicine is working to lower her heart rate I'd recommend she stay on it until she sees cardiology. If unsure or symptoms very bothersome, please return to clinic for re-eval.

## 2020-02-20 NOTE — Telephone Encounter (Signed)
Patient began taking Metoprolol succinate 25 mg tab on 02/09/20.  Over the last 3 days she has been sweating a lot more than usual. Takes the medication at night, sweating more than usual at night and during the day without increased activity. Denies any other symptoms no other new medications. Questioning if she should continue taking or discontinue? She began taking due to elevated heart rate, now heart rate in the 90's. Routing to provider for advice.  Reason for Disposition  [1] Caller has URGENT medicine question about med that PCP or specialist prescribed AND [2] triager unable to answer question  Answer Assessment - Initial Assessment Questions 1. NAME of MEDICATION: "What medicine are you calling about?"     Toprol xl 2. QUESTION: "What is your question?" (e.g., medication refill, side effect)     I have been sweating a lot more than usual over the last 3 days. Could this medication be causing.  3. PRESCRIBING HCP: "Who prescribed it?" Reason: if prescribed by specialist, call should be referred to that group.     Dr. Marlyn Corporal 4. SYMPTOMS: "Do you have any symptoms?"     sweating 5. SEVERITY: If symptoms are present, ask "Are they mild, moderate or severe?"     moderate 6. PREGNANCY:  "Is there any chance that you are pregnant?" "When was your last menstrual period?"     no  Protocols used: MEDICATION QUESTION CALL-A-AH

## 2020-02-21 NOTE — Telephone Encounter (Signed)
Advised patient as below.  

## 2020-03-08 ENCOUNTER — Encounter: Payer: Self-pay | Admitting: Cardiology

## 2020-03-08 ENCOUNTER — Other Ambulatory Visit: Payer: Self-pay

## 2020-03-08 ENCOUNTER — Ambulatory Visit (INDEPENDENT_AMBULATORY_CARE_PROVIDER_SITE_OTHER): Payer: Medicaid Other | Admitting: Cardiology

## 2020-03-08 VITALS — BP 106/70 | HR 95 | Ht 64.0 in | Wt 168.1 lb

## 2020-03-08 DIAGNOSIS — R9431 Abnormal electrocardiogram [ECG] [EKG]: Secondary | ICD-10-CM

## 2020-03-08 DIAGNOSIS — R002 Palpitations: Secondary | ICD-10-CM | POA: Diagnosis not present

## 2020-03-08 NOTE — Progress Notes (Signed)
Cardiology Office Note:    Date:  04/02/9832   ID:  [name], DOB [birth year], MRN [id number]  PCP:  Trinna Post, PA-C  [place] Cardiologist:  Kate Sable, MD  [place] Electrophysiologist:  None   Referring MD: Florian Buff*   Chief Complaint  Patient presents with  . New Patient (Initial Visit)    Patietn reports intermittent episodes of palpitations with elevated HR over the last several weeks. She states symptoms have improved since starting metoprolol; Meds verbally reviewed with patient.    History of Present Illness:    Dominique Navarro is a 29 y.o. female with a hx of depression, anxiety, migraine who presents due to abnormal ECG.  Patient states having episodes of palpitations with elevated heart rates over the past month also.  EKG obtained at her primary provider's office on 02/09/2020 showed sinus tachycardia, heart rate 103, PR interval 114.  She was started on Toprol-XL 25 mg daily.  She states her symptoms of palpitations have improved since starting Toprol-XL.  She drinks 4 cups of coffee per day, this is after cutting back.  She denies drinking energy drinks.  She denies any history of heart disease.  Denies chest pain, shortness of breath, edema.  Previously, she noted elevated heart rates at rest up to low 100s.  This has improved since starting metoprolol.  Past Medical History:  Diagnosis Date  . Depression   . Migraine     Past Surgical History:  Procedure Laterality Date  . CYST REMOVAL NECK      Current Medications: Current Meds  Medication Sig  . DULoxetine (CYMBALTA) 20 MG capsule TAKE 1 CAPSULE BY MOUTH EVERY DAY  . EPINEPHrine 0.3 mg/0.3 mL IJ SOAJ injection Inject 0.3 mLs (0.3 mg total) into the muscle as needed for anaphylaxis.  Marland Kitchen etonogestrel (NEXPLANON) 68 MG IMPL implant 1 each by Subdermal route once.  . meloxicam (MOBIC) 15 MG tablet TAKE 1 TABLET BY MOUTH EVERY DAY  . metoprolol succinate (TOPROL-XL) 25 MG 24 hr  tablet Take 1 tablet (25 mg total) by mouth daily.  . montelukast (SINGULAIR) 10 MG tablet TAKE 1 TABLET BY MOUTH EVERYDAY AT BEDTIME  . Multiple Vitamin (MULTIVITAMIN WITH MINERALS) TABS tablet Take 1 tablet by mouth daily.  . Multiple Vitamins-Minerals (MULTIVITAMIN WITH MINERALS) tablet Take by mouth.  . nortriptyline (PAMELOR) 25 MG capsule Take by mouth.  . promethazine (PHENERGAN) 12.5 MG tablet Take 1 tablet (12.5 mg total) by mouth every 8 (eight) hours as needed for nausea or vomiting.  . SUMAtriptan (IMITREX) 50 MG tablet TAKE 1 TABLET BY MOUTH EVERY 2 HOURS AS NEEDED FOR MIGRAINE. MAY REPEAT IN 2 HOURS IF HEADACHE PERSISTS OR RECURS.     Allergies:   Sulfur, Bee venom, and Sulfa antibiotics   Social History   Socioeconomic History  . Marital status: Married    Spouse name: Not on file  . Number of children: Not on file  . Years of education: Not on file  . Highest education level: Not on file  Occupational History  . Not on file  Tobacco Use  . Smoking status: Former Smoker    Types: Cigarettes    Quit date: 02/23/2019    Years since quitting: 1.0  . Smokeless tobacco: Never Used  Vaping Use  . Vaping Use: Never used  Substance and Sexual Activity  . Alcohol use: Never  . Drug use: Never  . Sexual activity: Not on file  Other Topics Concern  .  Not on file  Social History Narrative  . Not on file   Social Determinants of Health   Financial Resource Strain:   . Difficulty of Paying Living Expenses:   Food Insecurity:   . Worried About Charity fundraiser in the Last Year:   . Arboriculturist in the Last Year:   Transportation Needs:   . Film/video editor (Medical):   Marland Kitchen Lack of Transportation (Non-Medical):   Physical Activity:   . Days of Exercise per Week:   . Minutes of Exercise per Session:   Stress:   . Feeling of Stress :   Social Connections:   . Frequency of Communication with Friends and Family:   . Frequency of Social Gatherings with Friends  and Family:   . Attends Religious Services:   . Active Member of Clubs or Organizations:   . Attends Archivist Meetings:   Marland Kitchen Marital Status:      Family History: The patient's family history includes Asthma in her mother; COPD in her father and mother; Seizures in her son.  ROS:   Please see the history of present illness.     All other systems reviewed and are negative.  EKGs/Labs/Other Studies Reviewed:    The following studies were reviewed today:   EKG:  EKG is  ordered today.  The ekg ordered today demonstrates normal sinus rhythm, normal ECG.  Recent Labs: 05/19/2019: ALT 22; BUN 12; Creatinine, Ser 0.94; Hemoglobin 13.8; Platelets 241; Potassium 4.0; Sodium 141; TSH 1.640  Recent Lipid Panel No results found for: CHOL, TRIG, HDL, CHOLHDL, VLDL, LDLCALC, LDLDIRECT  Physical Exam:    VS:  BP 106/70 (BP Location: Right Arm, Patient Position: Sitting, Cuff Size: Normal)   Pulse 95   Ht 5\' 4"  (1.626 m)   Wt 168 lb 2 oz (76.3 kg)   SpO2 98%   BMI 28.86 kg/m     Wt Readings from Last 3 Encounters:  03/08/20 168 lb 2 oz (76.3 kg)  02/09/20 165 lb (74.8 kg)  01/07/20 162 lb (73.5 kg)     GEN:  Well nourished, well developed in no acute distress HEENT: Normal NECK: No JVD; No carotid bruits LYMPHATICS: No lymphadenopathy CARDIAC: RRR, no murmurs, rubs, gallops RESPIRATORY:  Clear to auscultation without rales, wheezing or rhonchi  ABDOMEN: Soft, non-tender, non-distended MUSCULOSKELETAL:  No edema; No deformity  SKIN: Warm and dry NEUROLOGIC:  Alert and oriented x 3 PSYCHIATRIC:  Normal affect   ASSESSMENT:    1. Palpitations   2. Nonspecific abnormal electrocardiogram (ECG) (EKG)    PLAN:    In order of problems listed above:  1. Patient with history of palpitations.  EKG at PCPs office reviewed by myself showed sinus tachycardia, EKG in the office today shows sinus rhythm.  Symptoms have improved since starting Toprol-XL.  Recommend she  continues Toprol-XL as prescribed.  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  History of anxiety might also be contributing to patient's symptoms.  Regardless, I think the current dose of beta-blocker is appropriate for both conditions. 2. Prior EKG shows sinus tachycardia and short PR interval, PR interval 114.  EKG in the office today shows sinus rhythm, normal PR interval.  Previous sharp PR interval duration has no clinical significance and no further testing indicated.  ECG reviewed by myself with no delta waves.  Patient reassured.  Follow-up as needed  This note was generated in part or whole with voice recognition software. Voice recognition is usually  quite accurate but there are transcription errors that can and very often do occur. I apologize for any typographical errors that were not detected and corrected.  Medication Adjustments/Labs and Tests Ordered: Current medicines are reviewed at length with the patient today.  Concerns regarding medicines are outlined above.  Orders Placed This Encounter  Procedures  . EKG 12-Lead   No orders of the defined types were placed in this encounter.   Patient Instructions  Medication Instructions:   Your physician recommends that you continue on your current medications as directed. Please refer to the Current Medication list given to you today.  *If you need a refill on your cardiac medications before your next appointment, please call your pharmacy*   Lab Work: None Ordered If you have labs (blood work) drawn today and your tests are completely normal, you will receive your results only by: Marland Kitchen MyChart Message (if you have MyChart) OR . A paper copy in the mail If you have any lab test that is abnormal or we need to change your treatment, we will call you to review the results.   Testing/Procedures: None ORdered   Follow-Up: At [facility], you and your health needs are our priority.  As part of our continuing mission to  provide you with exceptional heart care, we have created designated Provider Care Teams.  These Care Teams include your primary Cardiologist (physician) and Advanced Practice Providers (APPs -  Physician Assistants and Nurse Practitioners) who all work together to provide you with the care you need, when you need it.  We recommend signing up for the patient portal called "MyChart".  Sign up information is provided on this After Visit Summary.  MyChart is used to connect with patients for Virtual Visits (Telemedicine).  Patients are able to view lab/test results, encounter notes, upcoming appointments, etc.  Non-urgent messages can be sent to your provider as well.   To learn more about what you can do with MyChart, go to [email or web address].    Your next appointment:   Follow up as needed   The format for your next appointment:   In Person  Provider:   Kate Sable, MD   Other Instructions      Signed, Kate Sable, MD  03/08/2020 12:05 PM    [place]

## 2020-03-08 NOTE — Patient Instructions (Signed)
Medication Instructions:   Your physician recommends that you continue on your current medications as directed. Please refer to the Current Medication list given to you today.  *If you need a refill on your cardiac medications before your next appointment, please call your pharmacy*   Lab Work: None Ordered If you have labs (blood work) drawn today and your tests are completely normal, you will receive your results only by: Marland Kitchen MyChart Message (if you have MyChart) OR . A paper copy in the mail If you have any lab test that is abnormal or we need to change your treatment, we will call you to review the results.   Testing/Procedures: None ORdered   Follow-Up: At [facility], you and your health needs are our priority.  As part of our continuing mission to provide you with exceptional heart care, we have created designated Provider Care Teams.  These Care Teams include your primary Cardiologist (physician) and Advanced Practice Providers (APPs -  Physician Assistants and Nurse Practitioners) who all work together to provide you with the care you need, when you need it.  We recommend signing up for the patient portal called "MyChart".  Sign up information is provided on this After Visit Summary.  MyChart is used to connect with patients for Virtual Visits (Telemedicine).  Patients are able to view lab/test results, encounter notes, upcoming appointments, etc.  Non-urgent messages can be sent to your provider as well.   To learn more about what you can do with MyChart, go to [email or web address].    Your next appointment:   Follow up as needed   The format for your next appointment:   In Person  Provider:   Kate Sable, MD   Other Instructions

## 2020-03-23 ENCOUNTER — Ambulatory Visit
Admission: EM | Admit: 2020-03-23 | Discharge: 2020-03-23 | Disposition: A | Discharge status: Home / Self Care | Payer: Medicaid Other | Attending: Emergency Medicine | Admitting: Emergency Medicine

## 2020-03-23 DIAGNOSIS — J209 Acute bronchitis, unspecified: Secondary | ICD-10-CM | POA: Diagnosis not present

## 2020-03-23 DIAGNOSIS — J0100 Acute maxillary sinusitis, unspecified: Secondary | ICD-10-CM | POA: Diagnosis not present

## 2020-03-23 MED ORDER — AMOXICILLIN 875 MG PO TABS: 875.0000 mg | ORAL_TABLET | Freq: Two times a day (BID) | ORAL | 0 refills | Status: AC

## 2020-03-23 MED ORDER — PREDNISONE 10 MG (21) PO TBPK: ORAL_TABLET | Freq: Every day | ORAL | 0 refills | Status: DC

## 2020-03-23 MED ORDER — ALBUTEROL SULFATE HFA 108 (90 BASE) MCG/ACT IN AERS
2.0000 | INHALATION_SPRAY | RESPIRATORY_TRACT | 0 refills | Status: AC | PRN
Start: 2020-03-23 — End: ?

## 2020-03-23 NOTE — ED Provider Notes (Signed)
[facility]    CSN: [id number] Arrival date & time: 03/23/20  1359      History   Chief Complaint Chief Complaint  Patient presents with  . Cough  . Nasal Congestion    and chest     HPI Dominique Navarro is a 29 y.o. female.   Patient presents with cough productive of yellow phlegm, nasal congestion, rhinorrhea, sinus pressure x6 days.  She denies fever, chills, shortness of breath, vomiting, diarrhea, rash, or other symptoms.  Treatment attempted at home with DayQuil and NyQuil.  She is not vaccinated for COVID.  The history is provided by the patient.    Past Medical History:  Diagnosis Date  . Depression   . Migraine     Patient Active Problem List   Diagnosis Date Noted  . 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 Arthralgia 06/06/2019  . Osteoporosis screening 06/06/2019  . Other insomnia 06/06/2019  . Migraine headache 05/14/2018  . Advance directive discussed with patient 09/02/2013  . Depression complicating pregnancy, antepartum 04/29/2013  . FHx: epilepsy 04/29/2013  . Medication exposure during first trimester of pregnancy 04/29/2013    Past Surgical History:  Procedure Laterality Date  . CYST REMOVAL NECK      OB History   No obstetric history on file.      Home Medications    Prior to Admission medications   Medication Sig Start Date End Date Taking? Authorizing Provider  albuterol (VENTOLIN HFA) 108 (90 Base) MCG/ACT inhaler Inhale 2 puffs into the lungs every 4 (four) hours as needed for wheezing or shortness of breath. 03/23/20   Sharion Balloon, NP  amoxicillin (AMOXIL) 875 MG tablet Take 1 tablet (875 mg total) by mouth 2 (two) times daily for 7 days. 03/23/20 03/30/20  Sharion Balloon, NP  DULoxetine (CYMBALTA) 20 MG capsule TAKE 1 CAPSULE BY MOUTH EVERY DAY 11/14/19   Trinna Post, PA-C  EPINEPHrine 0.3 mg/0.3 mL IJ SOAJ injection Inject 0.3 mLs (0.3 mg total) into the muscle as needed for anaphylaxis.  12/02/19   Trinna Post, PA-C  etonogestrel (NEXPLANON) 68 MG IMPL implant 1 each by Subdermal route once. 12/08/19   [provider]  ketorolac (TORADOL) 10 MG tablet Take 1 tablet (10 mg total) by mouth every 8 (eight) hours. 01/07/20   Menshew, Dannielle Karvonen, PA-C  meloxicam (MOBIC) 15 MG tablet TAKE 1 TABLET BY MOUTH EVERY DAY 02/17/20   Trinna Post, PA-C  metoprolol succinate (TOPROL-XL) 25 MG 24 hr tablet Take 1 tablet (25 mg total) by mouth daily. 02/09/20   Mar Daring, PA-C  montelukast (SINGULAIR) 10 MG tablet TAKE 1 TABLET BY MOUTH EVERYDAY AT BEDTIME 11/14/19   Trinna Post, PA-C  Multiple Vitamin (MULTIVITAMIN WITH MINERALS) TABS tablet Take 1 tablet by mouth daily.    [provider]  Multiple Vitamins-Minerals (MULTIVITAMIN WITH MINERALS) tablet Take by mouth.    [provider]  nortriptyline (PAMELOR) 25 MG capsule Take by mouth. 09/16/19   [provider]  predniSONE (STERAPRED UNI-PAK 21 TAB) 10 MG (21) TBPK tablet Take by mouth daily. Take 6 tabs by mouth daily  for 1 day, then 5 tabs for 1 day, then 4 tabs for 1 day, then 3 tabs for 1 day, 2 tabs for 1 day, then 1 tab by mouth daily for 1 day 03/23/20   Sharion Balloon, NP  promethazine (PHENERGAN) 12.5 MG tablet Take 1 tablet (12.5 mg  total) by mouth every 8 (eight) hours as needed for nausea or vomiting. 08/31/19   Trinna Post, PA-C  SUMAtriptan (IMITREX) 50 MG tablet TAKE 1 TABLET BY MOUTH EVERY 2 HOURS AS NEEDED FOR MIGRAINE. MAY REPEAT IN 2 HOURS IF HEADACHE PERSISTS OR RECURS. 01/17/20   Trinna Post, PA-C  amitriptyline (ELAVIL) 25 MG tablet Take 1 tablet (25 mg total) by mouth at bedtime. 08/31/19 01/07/20  Trinna Post, PA-C    Family History Family History  Problem Relation Age of Onset  . Asthma Mother   . COPD Mother   . COPD Father   . Seizures Son     Social History Social History   Tobacco Use  . Smoking status: Former Smoker    Types:  Cigarettes    Quit date: 02/23/2019    Years since quitting: 1.0  . Smokeless tobacco: Never Used  Vaping Use  . Vaping Use: Never used  Substance Use Topics  . Alcohol use: Never  . Drug use: Never     Allergies   Sulfur, Bee venom, and Sulfa antibiotics   Review of Systems Review of Systems  Constitutional: Negative for chills and fever.  HENT: Positive for congestion, postnasal drip, rhinorrhea and sinus pressure. Negative for ear pain and sore throat.   Eyes: Negative for pain and visual disturbance.  Respiratory: Positive for cough. Negative for shortness of breath.   Cardiovascular: Negative for chest pain and palpitations.  Gastrointestinal: Negative for abdominal pain and vomiting.  Genitourinary: Negative for dysuria and hematuria.  Musculoskeletal: Negative for arthralgias and back pain.  Skin: Negative for color change and rash.  Neurological: Negative for seizures and syncope.  All other systems reviewed and are negative.    Physical Exam Triage Vital Signs ED Triage Vitals  Enc Vitals Group     BP 03/23/20 1404 111/82     Pulse Rate 03/23/20 1404 99     Resp 03/23/20 1404 20     Temp 03/23/20 1404 98.1 F (36.7 C)     Temp src --      SpO2 03/23/20 1404 98 %     Weight --      Height --      Head Circumference --      Peak Flow --      Pain Score 03/23/20 1400 0     Pain Loc --      Pain Edu? --      Excl. in [place]? --    No data found.  Updated Vital Signs BP 111/82   Pulse 99   Temp 98.1 F (36.7 C)   Resp 20   SpO2 98%   Visual Acuity Right Eye Distance:   Left Eye Distance:   Bilateral Distance:    Right Eye Near:   Left Eye Near:    Bilateral Near:     Physical Exam Vitals and nursing note reviewed.  Constitutional:      General: She is not in acute distress.    Appearance: She is well-developed.  HENT:     Head: Normocephalic and atraumatic.     Right Ear: Tympanic membrane normal.     Left Ear: Tympanic membrane normal.      Nose: Congestion and rhinorrhea present.     Mouth/Throat:     Mouth: Mucous membranes are moist.     Pharynx: Oropharynx is clear.  Eyes:     Conjunctiva/sclera: Conjunctivae normal.  Cardiovascular:     Rate and Rhythm:  Normal rate and regular rhythm.     Heart sounds: No murmur heard.   Pulmonary:     Effort: Pulmonary effort is normal. No respiratory distress.     Breath sounds: Normal breath sounds. No wheezing or rhonchi.  Abdominal:     Palpations: Abdomen is soft.     Tenderness: There is no abdominal tenderness. There is no guarding or rebound.  Musculoskeletal:     Cervical back: Neck supple.  Skin:    General: Skin is warm and dry.     Findings: No rash.  Neurological:     General: No focal deficit present.     Mental Status: She is alert and oriented to person, place, and time.     Gait: Gait normal.  Psychiatric:        Mood and Affect: Mood normal.        Behavior: Behavior normal.      UC Treatments / Results  Labs (all labs ordered are listed, but only abnormal results are displayed) Labs Reviewed - No data to display  EKG   Radiology No results found.  Procedures Procedures (including critical care time)  Medications Ordered in UC Medications - No data to display  Initial Impression / Assessment and Plan / UC Course  I have reviewed the triage vital signs and the nursing notes.  Pertinent labs & imaging results that were available during my care of the patient were reviewed by me and considered in my medical decision making (see chart for details).   Acute sinusitis and bronchitis.  Treating with albuterol inhaler, prednisone, amoxicillin.  Patient declines COVID test today.  Instructed her to follow-up with her PCP if her symptoms are not improving.  Patient agrees to plan of care.      Final Clinical Impressions(s) / UC Diagnoses   Final diagnoses:  Acute non-recurrent maxillary sinusitis  Acute bronchitis, unspecified organism      Discharge Instructions     Take the prednisone and amoxicillin as directed.  Use the albuterol inhaler as directed.    Follow up with your primary care provider if your symptoms are not improving.       ED Prescriptions    Medication Sig Dispense Auth. Provider   albuterol (VENTOLIN HFA) 108 (90 Base) MCG/ACT inhaler Inhale 2 puffs into the lungs every 4 (four) hours as needed for wheezing or shortness of breath. 18 g Sharion Balloon, NP   predniSONE (STERAPRED UNI-PAK 21 TAB) 10 MG (21) TBPK tablet Take by mouth daily. Take 6 tabs by mouth daily  for 1 day, then 5 tabs for 1 day, then 4 tabs for 1 day, then 3 tabs for 1 day, 2 tabs for 1 day, then 1 tab by mouth daily for 1 day 21 tablet Sharion Balloon, NP   amoxicillin (AMOXIL) 875 MG tablet Take 1 tablet (875 mg total) by mouth 2 (two) times daily for 7 days. 14 tablet Sharion Balloon, NP     PDMP not reviewed this encounter.   Sharion Balloon, NP 03/23/20 1431

## 2020-03-23 NOTE — Discharge Instructions (Signed)
Take the prednisone and amoxicillin as directed.  Use the albuterol inhaler as directed.    Follow up with your primary care provider if your symptoms are not improving.

## 2020-03-23 NOTE — ED Triage Notes (Signed)
Patient reports cough and nasal/chest congestion since Sunday. Reports her 30 year old daughter and 46 year old son both have similar symptoms. Patient states this feels like previous times she has been diagnosed with bronchitis.

## 2020-05-02 ENCOUNTER — Other Ambulatory Visit: Payer: Self-pay | Admitting: Physician Assistant

## 2020-05-02 DIAGNOSIS — M791 Myalgia, unspecified site: Secondary | ICD-10-CM

## 2020-05-02 DIAGNOSIS — M2550 Pain in unspecified joint: Secondary | ICD-10-CM

## 2020-05-02 DIAGNOSIS — F32A Depression, unspecified: Secondary | ICD-10-CM

## 2020-05-15 ENCOUNTER — Encounter: Payer: Self-pay | Admitting: Emergency Medicine

## 2020-05-15 ENCOUNTER — Emergency Department
Admission: EM | Admit: 2020-05-15 | Discharge: 2020-05-15 | Disposition: A | Discharge status: Home / Self Care | Payer: Medicaid Other | Attending: Emergency Medicine | Admitting: Emergency Medicine

## 2020-05-15 DIAGNOSIS — S161XXA Strain of muscle, fascia and tendon at neck level, initial encounter: Secondary | ICD-10-CM | POA: Diagnosis not present

## 2020-05-15 DIAGNOSIS — Z87891 Personal history of nicotine dependence: Secondary | ICD-10-CM | POA: Diagnosis not present

## 2020-05-15 DIAGNOSIS — Z79899 Other long term (current) drug therapy: Secondary | ICD-10-CM | POA: Diagnosis not present

## 2020-05-15 DIAGNOSIS — S199XXA Unspecified injury of neck, initial encounter: Secondary | ICD-10-CM | POA: Diagnosis present

## 2020-05-15 DIAGNOSIS — M5412 Radiculopathy, cervical region: Secondary | ICD-10-CM | POA: Insufficient documentation

## 2020-05-15 MED ORDER — KETOROLAC TROMETHAMINE 30 MG/ML IJ SOLN
30.0000 mg | Freq: Once | INTRAMUSCULAR | Status: AC
  Administered 2020-05-15: 30 mg via INTRAMUSCULAR
  Filled 2020-05-15: qty 1

## 2020-05-15 MED ORDER — PROMETHAZINE HCL 25 MG/ML IJ SOLN
25.0000 mg | Freq: Once | INTRAMUSCULAR | Status: AC
  Administered 2020-05-15: 25 mg via INTRAMUSCULAR
  Filled 2020-05-15: qty 1

## 2020-05-15 MED ORDER — METHOCARBAMOL 500 MG PO TABS
1000.0000 mg | ORAL_TABLET | Freq: Once | ORAL | Status: AC
  Administered 2020-05-15: 1000 mg via ORAL
  Filled 2020-05-15: qty 2

## 2020-05-15 MED ORDER — PREDNISONE 50 MG PO TABS
50.0000 mg | ORAL_TABLET | Freq: Every day | ORAL | 0 refills | Status: DC
Start: 2020-05-15 — End: 2020-05-22

## 2020-05-15 MED ORDER — METHOCARBAMOL 500 MG PO TABS: 500.0000 mg | ORAL_TABLET | Freq: Four times a day (QID) | ORAL | 0 refills | Status: DC

## 2020-05-15 MED ORDER — DIPHENHYDRAMINE HCL 50 MG/ML IJ SOLN
50.0000 mg | Freq: Once | INTRAMUSCULAR | Status: AC
  Administered 2020-05-15: 50 mg via INTRAMUSCULAR
  Filled 2020-05-15: qty 1
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
## 2020-05-21 ENCOUNTER — Ambulatory Visit: Payer: Self-pay
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
## 2020-05-22 ENCOUNTER — Other Ambulatory Visit: Payer: Self-pay

## 2020-05-22 ENCOUNTER — Ambulatory Visit: Payer: Medicaid Other | Admitting: Physician Assistant

## 2020-05-22 ENCOUNTER — Encounter: Payer: Self-pay | Admitting: Physician Assistant

## 2020-05-22 VITALS — BP 108/77 | HR 77 | Temp 98.1°F | Wt 168.8 lb

## 2020-05-22 DIAGNOSIS — F419 Anxiety disorder, unspecified: Secondary | ICD-10-CM

## 2020-05-22 DIAGNOSIS — R41840 Attention and concentration deficit: Secondary | ICD-10-CM

## 2020-05-22 DIAGNOSIS — F329 Major depressive disorder, single episode, unspecified: Secondary | ICD-10-CM

## 2020-05-22 DIAGNOSIS — M545 Low back pain, unspecified: Secondary | ICD-10-CM

## 2020-05-22 DIAGNOSIS — Z975 Presence of (intrauterine) contraceptive device: Secondary | ICD-10-CM | POA: Diagnosis not present

## 2020-05-22 DIAGNOSIS — F32A Depression, unspecified: Secondary | ICD-10-CM

## 2020-05-22 MED ORDER — TIZANIDINE HCL 2 MG PO CAPS: 2.0000 mg | ORAL_CAPSULE | Freq: Every day | ORAL | 1 refills | Status: DC

## 2020-05-22 MED ORDER — PREDNISONE 10 MG PO TABS: ORAL_TABLET | ORAL | 0 refills | Status: DC
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
## 2020-05-23 ENCOUNTER — Telehealth: Payer: Self-pay | Admitting: *Deleted

## 2020-05-23 ENCOUNTER — Telehealth: Payer: Self-pay | Admitting: Physician Assistant

## 2020-05-23 DIAGNOSIS — R41840 Attention and concentration deficit: Secondary | ICD-10-CM

## 2020-05-23 NOTE — Telephone Encounter (Signed)
Can we let patient know ADHD testing in [place] will be scheduled out in March? There may be availability sooner in [place]. Is it OK to place referral in [place]?

## 2020-05-23 NOTE — Telephone Encounter (Signed)
Patient was advised and states that the referral can be placed in [place].

## 2020-05-23 NOTE — Addendum Note (Signed)
Addended by: Trinna Post on: 05/23/2020 01:07 PM   Modules accepted: Orders

## 2020-05-23 NOTE — Chronic Care Management (AMB) (Signed)
  Care Management   Note  3/66/2947 Name: Elliot Simoneaux MRN: [id number] DOB: [birth year]  Starkisha Tullis is a 29 y.o. year old female who is a primary care patient of Paulene Floor. I reached out to [name] by phone today in response to a referral sent by Ms. Magdalynn Giddens's PCP, Trinna Post, PA-C.    Ms. Marrone was given information about care management services today including:  1. Care management services include personalized support from designated clinical staff supervised by her physician, including individualized plan of care and coordination with other care providers 2. 24/7 contact phone numbers for assistance for urgent and routine care needs. 3. The patient may stop care management services at any time by phone call to the office staff.  Patient agreed to services and verbal consent obtained.   Follow up plan: Telephone appointment with care management team member scheduled for: 05/31/2020  [facility]

## 2020-05-31 ENCOUNTER — Ambulatory Visit: Payer: Medicaid Other | Admitting: *Deleted

## 2020-05-31 DIAGNOSIS — F32A Depression, unspecified: Secondary | ICD-10-CM

## 2020-05-31 DIAGNOSIS — R41840 Attention and concentration deficit: Secondary | ICD-10-CM

## 2020-05-31 NOTE — Patient Instructions (Addendum)
Thank you allowing the Chronic Care Management Team to be a part of your care! It was a pleasure speaking with you today!  1. Please anticipate a phone call from the [facility] to schedule initial intake appointment  CCM (Chronic Care Management) Team   Neldon Labella RN, BSN Nurse Care Coordinator  [phone]  [place], [place] Social Worker [phone]  Goals Addressed            This Visit's Progress   . Begin and Stick with Counseling       Follow Up Date 06/21/2020   - check out counseling - keep 90 percent of counseling appointments - schedule counseling appointment    Why is this important?   Beating depression may take some time.  If you don't feel better right away, don't give up on your treatment plan.    Notes:     . Find Help in My Community       Follow Up Date 06/07/2020   - follow-up on any referrals for help I am given - have a back-up plan    Why is this important?   Knowing how and where to find help for yourself or family in your neighborhood and community is an important skill.  You will want to take some steps to learn how.    Notes:     Marland Kitchen Manage My Emotions       Follow Up Date 06/07/2020   - begin personal counseling - talk about feelings with a friend, family or spiritual advisor - practice positive thinking and self-talk    Why is this important?   When you are stressed, down or upset, your body reacts too.  For example, your blood pressure may get higher; you may have a headache or stomachache.  When your emotions get the best of you, your body's ability to fight off cold and flu gets weak.  These steps will help you manage your emotions.     Notes:         The patient verbalized understanding of instructions provided today and declined a print copy of patient instruction materials.   Telephone follow up appointment with care management team member scheduled for: 06/08/20

## 2020-05-31 NOTE — Chronic Care Management (AMB) (Signed)
Care Management   Follow Up Note   97/04/8920 Name: Dominique Navarro MRN: [id number] DOB: [birth year]  Referred by: Trinna Post, PA-C Reason for referral : Chronic Care Management   Dominique Navarro is a 29 y.o. year old female who is a primary care patient of Trinna Post, [place]. The CCM team was consulted for assistance with Mental Health Counseling and Resources.    Review of patient status, including review of consultants reports, relevant laboratory and other test results, and collaboration with appropriate care team members and the patient's provider was performed as part of comprehensive patient evaluation and provision of chronic care management services.    SDOH (Social Determinants of Health) assessments performed: Yes See Care Plan activities for detailed interventions related to SDOH)  SDOH Interventions     Most Recent Value  SDOH Interventions  SDOH Interventions for the Following Domains Stress  Stress Interventions Other (Comment)  [patient to be referred for ongoing outpatient counseling]       Outpatient Encounter Medications as of 05/31/2020  Medication Sig   albuterol (VENTOLIN HFA) 108 (90 Base) MCG/ACT inhaler Inhale 2 puffs into the lungs every 4 (four) hours as needed for wheezing or shortness of breath.   DULoxetine (CYMBALTA) 20 MG capsule TAKE 1 CAPSULE BY MOUTH EVERY DAY   EPINEPHrine 0.3 mg/0.3 mL IJ SOAJ injection Inject 0.3 mLs (0.3 mg total) into the muscle as needed for anaphylaxis.   etonogestrel (NEXPLANON) 68 MG IMPL implant 1 each by Subdermal route once.   meloxicam (MOBIC) 15 MG tablet TAKE 1 TABLET BY MOUTH EVERY DAY   montelukast (SINGULAIR) 10 MG tablet TAKE 1 TABLET BY MOUTH EVERYDAY AT BEDTIME   Multiple Vitamin (MULTIVITAMIN WITH MINERALS) TABS tablet Take 1 tablet by mouth daily.   Multiple Vitamins-Minerals (MULTIVITAMIN WITH MINERALS) tablet Take by mouth.   nortriptyline (PAMELOR) 25 MG capsule Take by mouth.   predniSONE  (DELTASONE) 10 MG tablet Take 6 pills on day 1, 5 pills on day 2 and so on until complete.   promethazine (PHENERGAN) 12.5 MG tablet Take 1 tablet (12.5 mg total) by mouth every 8 (eight) hours as needed for nausea or vomiting.   SUMAtriptan (IMITREX) 50 MG tablet TAKE 1 TABLET BY MOUTH EVERY 2 HOURS AS NEEDED FOR MIGRAINE. MAY REPEAT IN 2 HOURS IF HEADACHE PERSISTS OR RECURS.   tizanidine (ZANAFLEX) 2 MG capsule Take 1 capsule (2 mg total) by mouth at bedtime.   [DISCONTINUED] amitriptyline (ELAVIL) 25 MG tablet Take 1 tablet (25 mg total) by mouth at bedtime.   No facility-administered encounter medications on file as of 05/31/2020.     Objective:   Goals Addressed            This Visit's Progress    Begin and Stick with Counseling       Follow Up Date 06/21/2020   - check out counseling - keep 90 percent of counseling appointments - schedule counseling appointment    Why is this important?   Beating depression may take some time.  If you don't feel better right away, don't give up on your treatment plan.    Notes:      Find Help in My Community       Follow Up Date 06/07/2020   - follow-up on any referrals for help I am given - have a back-up plan    Why is this important?   Knowing how and where to find help for yourself or family in your  neighborhood and community is an Soil scientist.  You will want to take some steps to learn how.    Notes:      Manage My Emotions       Follow Up Date 06/07/2020   - begin personal counseling - talk about feelings with a friend, family or spiritual advisor - practice positive thinking and self-talk    Why is this important?   When you are stressed, down or upset, your body reacts too.  For example, your blood pressure may get higher; you may have a headache or stomachache.  When your emotions get the best of you, your body's ability to fight off cold and flu gets weak.  These steps will help you manage your emotions.      Notes:        Patient Care Plan: General Plan of Care (Adult)    Problem Identified: Coping Skills (General Plan of Care)   Priority: High    Goal: Depressive Symptoms Identified   Start Date: 05/31/2020  Priority: High  Note:   Evidence-based guidance:  Identify risk for depression by reviewing presenting symptoms and risk factors.  Review use of medications that contribute to depression such as steroid, narcotic, sedative, antihypertensive, beta blocker, cytoxic agent.  Review related metabolic processes, including infection, anemia, thyroid dysfunction, kidney failure, heart failure, alcohol or substance use.  Perform depression screening using standardized tools to obtain baseline intensity of depressive symptoms.  Perform or refer for a full diagnostic interview when positive screening results are noted; use DSM-5 criteria to determine appropriate diagnosis (e.g., major depression, persistent depressive disorder, unspecified depressive   disorder).   Notes:    Task: Identify Depressive Symptoms and Facilitate Treatment   Due Date: 07/02/2020  Priority: Routine  Note:   Care Management Activities:    - mental health treatment arranged - participation in psychiatric services encouraged    Notes:    Long-Range Goal: Coping Skills Enhanced   Start Date: 05/31/2020  Expected End Date: 06/21/2020  Priority: High  Note:   Evidence-based guidance:  Acknowledge, normalize and validate difficulty of making life-long lifestyle changes.  Identify current effective and ineffective coping strategies.  Encourage patient and caregiver participation in care to increase self-esteem, confidence and feelings of control.  Consider alternative and complementary therapy approaches such as meditation, mindfulness or yoga.  Encourage participation in cognitive behavioral therapy to foster a positive identity, increase self-awareness, as well as bolster self-esteem, confidence and  self-efficacy.  Discuss spirituality; be present as concerns are identified; encourage journaling, prayer, worship services, meditation or pastoral counseling.  Encourage participation in pleasurable group activities such as hobbies, singing, sports or volunteering).  Encourage the use of mindfulness; refer for training or intensive intervention.  Consider the use of meditative movement therapy such as tai chi, yoga or qigong.  Promote a regular daily exercise program based on tolerance, ability and patient choice to support positive thinking about disease or aging.   Notes:    Task: Support Psychosocial Response to Risk or Actual Health Condition   Due Date: 06/21/2020  Priority: Routine  Note:   Care Management Activities:    - active listening utilized - healthy lifestyle promoted - participation in counseling encouraged - verbalization of feelings encouraged    Notes:    Task: Arrange behavioral health services   Due Date: 07/02/2020  Priority: Routine    Problem Identified: Therapeutic Alliance (General Plan of Care)   Priority: High    Goal: Anxiety Symptoms Identified  Start Date: 05/31/2020  Expected End Date: 07/02/2020  Priority: High  Note:   Evidence-based guidance:  Assess for presence of additional co-occurring psychiatric comorbidity [e.g., substance use, other anxiety disorder (specific phobia, social anxiety disorder, panic disorder, agoraphobia, substance or medically-induced   anxiety disorder)].  Assess for presence of medical comorbidity (e.g., chronic pain, chronic illness), recent or recurrent trauma or abuse, family history of substance use disorder or mental illness.  Screen for anxiety using standardized, validated tool.  Move gradually from investigating somatic complaints to exploring social or psychologic distress.  Assess for signs and symptoms of anxiety in an atmosphere of hope and optimism.  Facilitate full diagnostic interview when positive  screening results are noted; utilize DSM-5 criteria to determine appropriate diagnosis.  Screen for depression simultaneously due to the frequency of co-occurrence.   Notes:    Task: Identify Anxiety Symptoms and Facilitate Treatment   Due Date: 07/02/2020  Priority: Routine  Note:   Care Management Activities:    - mental health treatment arranged    Notes:    Task: Arrange behavioral health services   Due Date: 07/02/2020  Priority: Routine    Long-Range Goal: Therapeutic Alliance Established   Start Date: 05/31/2020  Expected End Date: 07/03/2020  Priority: High  Note:   Evidence-based guidance:  Avoid value judgments; convey acceptance.  Encourage collaboration with the treatment team.  Establish rapport; develop trust relationship.  Therapist, art.  Provide emotional support; encourage patient to share feelings of anger, fear and anxiety.  Promote self-reliance and autonomy based on age and ability; discourage overprotection.  Use empathy and nonjudgmental, participatory manner.   Notes:    Task: Develop Relationship to Effect Behavior Change   Due Date: 06/21/2020  Priority: Routine  Note:   Care Management Activities:    - acceptance conveyed - choices provided - emotional support provided - empathic listening provided - questions answered - questions encouraged - verbalization of feelings encouraged    Notes:    Task: Arrange behavioral health services     Patient Care Plan: Depression (Adult)    Problem Identified: Depression Identification (Depression)   Priority: High    Goal: Depressive Symptoms Identified   Start Date: 05/31/2020  Expected End Date: 07/02/2020  Note:   Evidence-based guidance:  Identify risk for depression by reviewing presenting symptoms and risk factors.  Review use of medications that contribute to depression such as steroid, narcotic, sedative, antihypertensive, beta blocker, cytoxic agent.  Review related metabolic  processes, including infection, anemia, thyroid dysfunction, kidney failure, heart failure, alcohol or substance use.  Perform depression screening using standardized tools to obtain baseline intensity of depressive symptoms.  Perform or refer for a full diagnostic interview when positive screening results are noted; use DSM-5 criteria to determine appropriate diagnosis (e.g., major depression, persistent depressive disorder, unspecified depressive   disorder).   Notes:    Task: Identify Depressive Symptoms and Facilitate Treatment   Due Date: 07/02/2020  Note:   Care Management Activities:    - mental health treatment arranged - participation in psychiatric services encouraged    Notes:    Problem Identified: Symptoms (Depression)   Priority: High    Goal: Symptoms Monitored and Managed   Start Date: 05/31/2020  Note:   Evidence-based guidance:  Promote use of complementary and alternative medicine therapy including exercise, relaxation, music, bright light or dance therapy, acupuncture, aromatherapy.  Monitor and promote nutrition, pain control and sleep/rest; provide guidance regarding sleep hygiene techniques when patient presents with insomnia.  Explore treatment options in an atmosphere of hope and optimism; support the belief that recovery is possible.  Facilitate and advocate for mental health treatment that may include depression-focused psychotherapy, cognitive-behavioral therapy, social-skills training, relaxation training, problem-solving therapy.  Prepare for use of pharmacotherapy, such as selective serotonin-reuptake inhibitor, tricyclic antidepressant, serotonin norepinephrine-reuptake inhibitor, amino ketone, monoamine-oxidase inhibitor based on presentation and risk factors.  Caution patients who choose over-the-counter S-adenosyl methionine (SAM-e) or St. John's Wort regarding potential drug interactions; engage pharmacist in consultation.  Monitor and manage response to  medication and therapy regularly; consider weekly for the first month, then monthly after 4 to 8 weeks of treatment until full remission or for 6 to 24 months; anticipate adjustments to plan.  Prepare patient for potential long-term pharmacologic treatment that may prevent a relapse.  Facilitate shared decision-making regarding treatment, particularly for major depression, that may include electroconvulsive therapy or transcranial magnetic stimulation.  Promote development of physical activity plan to increase the secretion of neurobiological markers, promote social interaction, distraction and confidence-building.  Explore means to support work reintegration, such as modified working hours, peer support or coaching and community job programs.  Consider referral to community-based health program for support and group activities, such as exercise classes.   Notes:    Task: Alleviate Barriers to Depression Treatment   Priority: Routine  Note:   Care Management Activities:    - emotional support provided - healthy lifestyle promoted    Notes:       Plan:   Telephone follow up appointment with care management team member scheduled for: 06/08/20   Elliot Gurney, [place] Worker  [facility] Care Management [phone]

## 2020-06-05 NOTE — Progress Notes (Signed)
Dominique Post, PA-C   Chief Complaint  Patient presents with   Nexplanon removal    headaches, gain weight, not interested in new [facility]   Vaginal Discharge    fishy odor, no itchiness/irritation x few months    HPI:      [name] is a 29 y.o. [id number] whose LMP was No LMP recorded. Patient has had an implant., presents today for NP eval of increased vag d/c with fishy odor, no irritation. Sx intermittent since birth of daughter, 6 yrs ago. Also notices a growth vaginally that has increased in size since delivery; thinks contributing to d/c and odor. No odor sx today. She is due for pap smear per report. No hx of abn paps/no hx of STDs.  She would also like nexplanon removed. Placed a few months ago with PCP. Having headaches, wt gain. Wants to conceive in near future again anyway. Was on depo for many yrs but changed directly to nexplanon. No vag bleeding/dysmen. Not currently sex active.    Past Medical History:  Diagnosis Date   Depression    Migraine     Past Surgical History:  Procedure Laterality Date   CYST REMOVAL NECK      Family History  Problem Relation Age of Onset   Asthma Mother    COPD Mother    COPD Father    Seizures Son     Social History   Socioeconomic History   Marital status: Married    Spouse name: Not on file   Number of children: Not on file   Years of education: Not on file   Highest education level: Not on file  Occupational History   Not on file  Tobacco Use   Smoking status: Former Smoker    Types: Cigarettes    Quit date: 02/23/2019    Years since quitting: 1.2   Smokeless tobacco: Never Used  Vaping Use   Vaping Use: Never used  Substance and Sexual Activity   Alcohol use: Never   Drug use: Never   Sexual activity: Not Currently    Birth control/protection: Implant  Other Topics Concern   Not on file  Social History Narrative   Not on file   Social Determinants of Health   Financial  Resource Strain:    Difficulty of Paying Living Expenses: Not on file  Food Insecurity: No Food Insecurity   Worried About Running Out of Food in the Last Year: Never true   [place] in the Last Year: Never true  Transportation Needs: No Transportation Needs   Lack of Transportation (Medical): No   Lack of Transportation (Non-Medical): No  Physical Activity:    Days of Exercise per Week: Not on file   Minutes of Exercise per Session: Not on file  Stress: Stress Concern Present   Feeling of Stress : To some extent  Social Connections:    Frequency of Communication with Friends and Family: Not on file   Frequency of Social Gatherings with Friends and Family: Not on file   Attends Religious Services: Not on Electrical engineer or Organizations: Not on file   Attends Archivist Meetings: Not on file   Marital Status: Not on file  Intimate Partner Violence: Not At Risk   Fear of Current or Ex-Partner: No   Emotionally Abused: No   Physically Abused: No   Sexually Abused: No    Outpatient Medications Prior to Visit  Medication Sig Dispense Refill   albuterol (VENTOLIN HFA) 108 (90 Base) MCG/ACT inhaler Inhale 2 puffs into the lungs every 4 (four) hours as needed for wheezing or shortness of breath. 18 g 0   azelastine (ASTELIN) 0.1 % nasal spray SMARTSIG:1 Spray(s) Both Nares Twice Daily PRN     cyclobenzaprine (FLEXERIL) 5 MG tablet Take 5-10 mg by mouth every 8 (eight) hours as needed.     DULoxetine (CYMBALTA) 20 MG capsule TAKE 1 CAPSULE BY MOUTH EVERY DAY 90 capsule 1   EPINEPHrine 0.3 mg/0.3 mL IJ SOAJ injection Inject 0.3 mLs (0.3 mg total) into the muscle as needed for anaphylaxis. 1 each 0   fluticasone (FLONASE) 50 MCG/ACT nasal spray Place into both nostrils.     meloxicam (MOBIC) 15 MG tablet TAKE 1 TABLET BY MOUTH EVERY DAY 90 tablet 1   montelukast (SINGULAIR) 10 MG tablet TAKE 1 TABLET BY MOUTH EVERYDAY AT BEDTIME 90  tablet 3   Multiple Vitamin (MULTIVITAMIN WITH MINERALS) TABS tablet Take 1 tablet by mouth daily.     nortriptyline (PAMELOR) 50 MG capsule Take 50 mg by mouth at bedtime.     promethazine (PHENERGAN) 12.5 MG tablet Take 1 tablet (12.5 mg total) by mouth every 8 (eight) hours as needed for nausea or vomiting. 20 tablet 1   rizatriptan (MAXALT) 10 MG tablet Take 10 mg by mouth 2 (two) times daily as needed.     tizanidine (ZANAFLEX) 2 MG capsule Take 1 capsule (2 mg total) by mouth at bedtime. 30 capsule 1   etonogestrel (NEXPLANON) 68 MG IMPL implant 1 each by Subdermal route once.     Multiple Vitamins-Minerals (MULTIVITAMIN WITH MINERALS) tablet Take by mouth.     nortriptyline (PAMELOR) 25 MG capsule Take by mouth.     predniSONE (DELTASONE) 10 MG tablet Take 6 pills on day 1, 5 pills on day 2 and so on until complete. 21 tablet 0   SUMAtriptan (IMITREX) 50 MG tablet TAKE 1 TABLET BY MOUTH EVERY 2 HOURS AS NEEDED FOR MIGRAINE. MAY REPEAT IN 2 HOURS IF HEADACHE PERSISTS OR RECURS. 10 tablet 1   No facility-administered medications prior to visit.      ROS:  Review of Systems  Constitutional: Negative for fever.  Gastrointestinal: Negative for blood in stool, constipation, diarrhea, nausea and vomiting.  Genitourinary: Positive for vaginal discharge. Negative for dyspareunia, dysuria, flank pain, frequency, hematuria, urgency, vaginal bleeding and vaginal pain.  Musculoskeletal: Negative for back pain.  Skin: Negative for rash.   BREAST: No symptoms   OBJECTIVE:   Vitals:  BP 90/60    Ht 5\' 4"  (1.626 m)    Wt 164 lb (74.4 kg)    BMI 28.15 kg/m   Physical Exam Vitals reviewed.  Constitutional:      Appearance: She is well-developed.  Pulmonary:     Effort: Pulmonary effort is normal.  Genitourinary:    General: Normal vulva.     Pubic Area: No rash.      Labia:        Right: No rash, tenderness or lesion.        Left: No rash, tenderness or lesion.       Vagina: Vaginal discharge present. No erythema or tenderness.     Cervix: Normal.     Uterus: Normal. Not enlarged and not tender.      Adnexa: Right adnexa normal and left adnexa normal.       Right: No mass or tenderness.  Left: No mass or tenderness.      Musculoskeletal:        General: Normal range of motion.     Cervical back: Normal range of motion.  Skin:    General: Skin is warm and dry.  Neurological:     General: No focal deficit present.     Mental Status: She is alert and oriented to person, place, and time.  Psychiatric:        Mood and Affect: Mood normal.        Behavior: Behavior normal.        Thought Content: Thought content normal.        Judgment: Judgment normal.    RESULTS: Results for orders placed or performed in visit on 06/06/20 (from the past 24 hour(s))  POCT Wet Prep with KOH     Status: Normal   Collection Time: 06/06/20 10:59 AM  Result Value Ref Range   Trichomonas, UA Negative    Clue Cells Wet Prep HPF POC neg    Epithelial Wet Prep HPF POC     Yeast Wet Prep HPF POC neg    Bacteria Wet Prep HPF POC     RBC Wet Prep HPF POC     WBC Wet Prep HPF POC     KOH Prep POC Negative Negative     Nexplanon removal Procedure note - The Nexplanon was noted in the patient's arm and the end was identified. The skin was cleansed with a Betadine solution. A small injection of subcutaneous lidocaine with epinephrine was given over the end of the implant. An incision was made at the end of the implant. The rod was noted in the incision and grasped with a hemostat. It was noted to be intact.  Steri-Strip was placed approximating the incision. Hemostasis was noted.  Results: Results for orders placed or performed in visit on 06/06/20 (from the past 24 hour(s))  POCT Wet Prep with KOH     Status: Normal   Collection Time: 06/06/20 10:59 AM  Result Value Ref Range   Trichomonas, UA Negative    Clue Cells Wet Prep HPF POC neg    Epithelial Wet Prep  HPF POC     Yeast Wet Prep HPF POC neg    Bacteria Wet Prep HPF POC     RBC Wet Prep HPF POC     WBC Wet Prep HPF POC     KOH Prep POC Negative Negative     Assessment/Plan: Vaginal discharge - Plan: POCT Wet Prep with KOH--neg wet prep/neg exam. No sx today. Try repHresh gel OTC if odor recurs. F/u prn. May need BV tx.   Nexplanon removal--She was told to remove the dressing in 12-24 hours, to keep the incision area dry for 24 hours and to remove the Steristrip in 2-3  days.  Notify us if any signs of tenderness, redness, pain, or fevers develop.  Cervical cancer screening - Plan: Cytology - PAP  Screening for HPV (human papillomavirus) - Plan: Cytology - PAP     Return in about 1 year (around 06/06/2021).  Johnattan Strassman B. Nasia Cannan, PA-C 06/06/2020 11:03 AM

## 2020-06-06 ENCOUNTER — Encounter: Payer: Self-pay | Admitting: Obstetrics and Gynecology

## 2020-06-06 ENCOUNTER — Other Ambulatory Visit: Payer: Self-pay

## 2020-06-06 ENCOUNTER — Other Ambulatory Visit (HOSPITAL_COMMUNITY)
Admission: RE | Admit: 2020-06-06 | Discharge: 2020-06-06 | Disposition: A | Discharge status: Home / Self Care | Payer: Medicaid Other | Source: Ambulatory Visit | Attending: Obstetrics and Gynecology | Admitting: Obstetrics and Gynecology

## 2020-06-06 ENCOUNTER — Ambulatory Visit (INDEPENDENT_AMBULATORY_CARE_PROVIDER_SITE_OTHER): Payer: Medicaid Other | Admitting: Obstetrics and Gynecology

## 2020-06-06 VITALS — BP 90/60 | Ht 64.0 in | Wt 164.0 lb

## 2020-06-06 DIAGNOSIS — Z1151 Encounter for screening for human papillomavirus (HPV): Secondary | ICD-10-CM | POA: Diagnosis not present

## 2020-06-06 DIAGNOSIS — N898 Other specified noninflammatory disorders of vagina: Secondary | ICD-10-CM | POA: Diagnosis not present

## 2020-06-06 DIAGNOSIS — Z3046 Encounter for surveillance of implantable subdermal contraceptive: Secondary | ICD-10-CM | POA: Diagnosis not present

## 2020-06-06 DIAGNOSIS — Z124 Encounter for screening for malignant neoplasm of cervix: Secondary | ICD-10-CM | POA: Insufficient documentation

## 2020-06-06 LAB — POCT WET PREP WITH KOH
Clue Cells Wet Prep HPF POC: NEGATIVE
KOH Prep POC: NEGATIVE
Trichomonas, UA: NEGATIVE
Yeast Wet Prep HPF POC: NEGATIVE

## 2020-06-06 NOTE — Patient Instructions (Addendum)
I value your feedback and entrusting [place] with your care. If you get a [facility] patient survey, I would appreciate you taking the time to let us know about your experience today. Thank you!  As of August 04, 2019, your lab results will be released to your MyChart immediately, before I even have a chance to see them. Please give me time to review them and contact you if there are any abnormalities. Thank you for your patience.   Remove the dressing in 24 hours,  keep the incision area dry for 24 hours and remove the Steristrip in 2-3  days.  Notify us if any signs of tenderness, redness, pain, or fevers develop.

## 2020-06-07 LAB — CYTOLOGY - PAP
Comment: NEGATIVE
Diagnosis: NEGATIVE
High risk HPV: NEGATIVE

## 2020-06-08 ENCOUNTER — Telehealth: Payer: Self-pay | Admitting: *Deleted

## 2020-06-08 NOTE — Telephone Encounter (Signed)
    Care Management   Unsuccessful Call Note 34/91/7915 Name: Robin Petrakis MRN: [id number] DOB: 1991-03-22  Patient  is a 29 year old female who sees Carles Collet, [place] for primary care. Carles Collet, PA-C asked the CCM team to consult the patient for Mental Health Counseling and Resources.     This social worker was unable to reach patient via telephone today for follow up call regarding mental health resources provided. I have left HIPAA compliant voicemail asking patient to return my call. (unsuccessful outreach #1).   Plan: Will follow-up within 7 business days via telephone.      Elliot Gurney, [place] Worker  [facility] [phone]

## 2020-06-11 ENCOUNTER — Ambulatory Visit: Payer: Self-pay | Admitting: *Deleted

## 2020-06-11 DIAGNOSIS — R41840 Attention and concentration deficit: Secondary | ICD-10-CM

## 2020-06-11 DIAGNOSIS — F419 Anxiety disorder, unspecified: Secondary | ICD-10-CM

## 2020-06-11 NOTE — Patient Instructions (Addendum)
Thank you allowing the Chronic Care Management Team to be a part of your care! It was a pleasure speaking with you today!  1. Please call this social worker with any questions or concerns regarding your mental health needs  CCM (Chronic Care Management) Team   Neldon Labella RN, BSN Nurse Care Coordinator  [phone]  [place], LCSW Clinical Social Worker [phone]  Goals Addressed            This Visit's Progress   . Begin and Stick with Counseling       Follow Up Date 06/21/2020   - check out counseling - keep 90 percent of counseling appointments - schedule counseling appointment    Why is this important?   Beating depression may take some time.  If you don't feel better right away, don't give up on your treatment plan.    Notes: Phone call to patient to follow up on referral to outpatient counseling. Per patient, she did receive a call and has called back but did not receive a return call. Collaboration phone call to the intake coordinator Huey Romans [phone] who confirmed reaching out to patient on 10/11 and will complete a follow up call today to schedule initial intake        The patient verbalized understanding of instructions provided today and declined a print copy of patient instruction materials.   Telephone follow up appointment with care management team member scheduled for:  06/18/20

## 2020-06-11 NOTE — Chronic Care Management (AMB) (Signed)
   Care Management    Clinical Social Work Follow Up Note  78/46/9629 Name: Dominique Navarro MRN: [id number] DOB: [birth year]  Dominique Navarro is a 29 y.o. year old female who is a primary care patient of Paulene Floor. The CCM team was consulted for assistance with Mental Health Counseling and Resources.   Review of patient status, including review of consultants reports, other relevant assessments, and collaboration with appropriate care team members and the patient's provider was performed as part of comprehensive patient evaluation and provision of chronic care management services.    SDOH (Social Determinants of Health) assessments performed: No    Outpatient Encounter Medications as of 06/11/2020  Medication Sig  . albuterol (VENTOLIN HFA) 108 (90 Base) MCG/ACT inhaler Inhale 2 puffs into the lungs every 4 (four) hours as needed for wheezing or shortness of breath.  Marland Kitchen azelastine (ASTELIN) 0.1 % nasal spray SMARTSIG:1 Spray(s) Both Nares Twice Daily PRN  . cyclobenzaprine (FLEXERIL) 5 MG tablet Take 5-10 mg by mouth every 8 (eight) hours as needed.  . DULoxetine (CYMBALTA) 20 MG capsule TAKE 1 CAPSULE BY MOUTH EVERY DAY  . EPINEPHrine 0.3 mg/0.3 mL IJ SOAJ injection Inject 0.3 mLs (0.3 mg total) into the muscle as needed for anaphylaxis.  . fluticasone (FLONASE) 50 MCG/ACT nasal spray Place into both nostrils.  . meloxicam (MOBIC) 15 MG tablet TAKE 1 TABLET BY MOUTH EVERY DAY  . montelukast (SINGULAIR) 10 MG tablet TAKE 1 TABLET BY MOUTH EVERYDAY AT BEDTIME  . Multiple Vitamin (MULTIVITAMIN WITH MINERALS) TABS tablet Take 1 tablet by mouth daily.  . nortriptyline (PAMELOR) 50 MG capsule Take 50 mg by mouth at bedtime.  . promethazine (PHENERGAN) 12.5 MG tablet Take 1 tablet (12.5 mg total) by mouth every 8 (eight) hours as needed for nausea or vomiting.  . rizatriptan (MAXALT) 10 MG tablet Take 10 mg by mouth 2 (two) times daily as needed.  . tizanidine (ZANAFLEX) 2 MG capsule Take 1  capsule (2 mg total) by mouth at bedtime.  . [DISCONTINUED] amitriptyline (ELAVIL) 25 MG tablet Take 1 tablet (25 mg total) by mouth at bedtime.   No facility-administered encounter medications on file as of 06/11/2020.     Goals Addressed            This Visit's Progress   . Begin and Stick with Counseling       Follow Up Date 06/21/2020   - check out counseling - keep 90 percent of counseling appointments - schedule counseling appointment    Why is this important?   Beating depression may take some time.  If you don't feel better right away, don't give up on your treatment plan.    Notes: Phone call to patient to follow up on referral to outpatient counseling. Per patient, she did receive a call and has called back but did not receive a return call. Collaboration phone call to the intake coordinator Huey Romans [phone] who confirmed reaching out to patient on 10/11 and will complete a follow up call today to schedule initial intake        Follow Up Plan: SW will follow up with patient by phone over the next 7-14 business days    [place], [place] Worker  [place] Care Management [phone]

## 2020-06-14 ENCOUNTER — Other Ambulatory Visit: Payer: Self-pay | Admitting: Physician Assistant

## 2020-06-14 DIAGNOSIS — M545 Low back pain, unspecified: Secondary | ICD-10-CM

## 2020-06-14 NOTE — Telephone Encounter (Signed)
Requested medication (s) are due for refill today - no  Requested medication (s) are on the active medication list -yes  Future visit scheduled -yes  Last refill: 05/23/20 #30 1 RF  Notes to clinic: Request non delegated rx  Requested Prescriptions  Pending Prescriptions Disp Refills   tiZANidine (ZANAFLEX) 2 MG tablet [Pharmacy Med Name: TIZANIDINE HCL 2 MG TABLET] 30 tablet 1    Sig: TAKE 1 CAPSULE (2 MG TOTAL) BY MOUTH AT BEDTIME.      Not Delegated - Cardiovascular:  Alpha-2 Agonists - tizanidine Failed - 06/14/2020 10:31 AM      Failed - This refill cannot be delegated      Passed - Valid encounter within last 6 months    Recent Outpatient Visits           3 weeks ago Nexplanon in place   [place], [place], [place]   4 months ago Rapid palpitations   [place], Clearnce Sorrel, [place]   5 months ago Encounter for initial prescription of [place], Dionne Bucy, MD   6 months ago Bilateral carpal tunnel syndrome   [place], [place], [place]   9 months ago Other migraine without status migrainosus, not intractable   [facility] Trinna Post, PA-C       Future Appointments             In 5 days Trinna Post, PA-C [employer] [employer], [place]   In 1 month Trinna Post, [place], [facility]                Requested Prescriptions  Pending Prescriptions Disp Refills   tiZANidine (ZANAFLEX) 2 MG tablet [Pharmacy Med Name: TIZANIDINE HCL 2 MG TABLET] 30 tablet 1    Sig: TAKE 1 CAPSULE (2 MG TOTAL) BY MOUTH AT BEDTIME.      Not Delegated - Cardiovascular:  Alpha-2 Agonists - tizanidine Failed - 06/14/2020 10:31 AM      Failed - This refill cannot be delegated      Passed - Valid encounter within last 6 months    Recent Outpatient Visits           3 weeks ago Nexplanon in place   [place], [place], [place]   4 months ago Rapid palpitations   [place], Clearnce Sorrel, [place]   5 months ago Encounter for initial prescription of [place], Dionne Bucy, MD   6 months ago Bilateral carpal tunnel syndrome   [place], [place], [place]   9 months ago Other migraine without status migrainosus, not intractable   [place], Wendee Beavers, PA-C       Future Appointments             In 5 days Trinna Post, PA-C [employer] [employer], [place]   In 1 month Trinna Post, [place], [place]

## 2020-06-15 NOTE — Telephone Encounter (Signed)
Please review. Thanks!  

## 2020-06-18 ENCOUNTER — Ambulatory Visit: Payer: Self-pay | Admitting: *Deleted

## 2020-06-18 DIAGNOSIS — F32A Depression, unspecified: Secondary | ICD-10-CM

## 2020-06-18 DIAGNOSIS — R41840 Attention and concentration deficit: Secondary | ICD-10-CM

## 2020-06-18 NOTE — Chronic Care Management (AMB) (Signed)
   Care Management    Clinical Social Work Follow Up Note  67/67/2094 Name: Dominique Navarro MRN: [id number] DOB: [birth year]  Dominique Navarro is a 29 y.o. year old female who is a primary care patient of Paulene Floor. The CCM team was consulted for assistance with Mental Health Counseling and Resources.   Review of patient status, including review of consultants reports, other relevant assessments, and collaboration with appropriate care team members and the patient's provider was performed as part of comprehensive patient evaluation and provision of chronic care management services.    SDOH (Social Determinants of Health) assessments performed: No    Outpatient Encounter Medications as of 06/18/2020  Medication Sig  . albuterol (VENTOLIN HFA) 108 (90 Base) MCG/ACT inhaler Inhale 2 puffs into the lungs every 4 (four) hours as needed for wheezing or shortness of breath.  Marland Kitchen azelastine (ASTELIN) 0.1 % nasal spray SMARTSIG:1 Spray(s) Both Nares Twice Daily PRN  . DULoxetine (CYMBALTA) 20 MG capsule TAKE 1 CAPSULE BY MOUTH EVERY DAY  . EPINEPHrine 0.3 mg/0.3 mL IJ SOAJ injection Inject 0.3 mLs (0.3 mg total) into the muscle as needed for anaphylaxis.  . fluticasone (FLONASE) 50 MCG/ACT nasal spray Place into both nostrils.  . meloxicam (MOBIC) 15 MG tablet TAKE 1 TABLET BY MOUTH EVERY DAY  . montelukast (SINGULAIR) 10 MG tablet TAKE 1 TABLET BY MOUTH EVERYDAY AT BEDTIME  . Multiple Vitamin (MULTIVITAMIN WITH MINERALS) TABS tablet Take 1 tablet by mouth daily.  . nortriptyline (PAMELOR) 50 MG capsule Take 50 mg by mouth at bedtime.  . promethazine (PHENERGAN) 12.5 MG tablet Take 1 tablet (12.5 mg total) by mouth every 8 (eight) hours as needed for nausea or vomiting.  . rizatriptan (MAXALT) 10 MG tablet Take 10 mg by mouth 2 (two) times daily as needed.  Marland Kitchen tiZANidine (ZANAFLEX) 2 MG tablet TAKE 1 CAPSULE (2 MG TOTAL) BY MOUTH AT BEDTIME.  . [DISCONTINUED] amitriptyline (ELAVIL) 25 MG tablet Take  1 tablet (25 mg total) by mouth at bedtime.   No facility-administered encounter medications on file as of 06/18/2020.     Goals Addressed            This Visit's Progress   . Begin and Stick with Counseling       Follow Up Date 06/18/2020   - check out counseling - keep 90 percent of counseling appointments - schedule counseling appointment    Why is this important?   Beating depression may take some time.  If you don't feel better right away, don't give up on your treatment plan.    Notes: Phone call to patient to follow up on referral to outpatient counseling. Per patient, she did receive a call and has a scheduled intake appointment today, however it will need to be re-scheduled due to a conflict. Patient agrees to re-scheduled appointment today.        Follow Up Plan: SW will follow up with patient by phone over the next 30 days    [place], [place] Worker  [place] Management [phone]

## 2020-06-18 NOTE — Progress Notes (Deleted)
     Established patient visit   Patient: Dominique Navarro   DOB: [birth year]   29 y.o. Female  MRN: [id number] Visit Date: 06/19/2020  Today's healthcare provider: Trinna Post, PA-C   No chief complaint on file.  Subjective    HPI  Anxiety, Follow-up  She was last seen for anxiety 4 weeks ago. Changes made at last visit include increasing Cymbalta to 40mg  daily.   She reports {excellent/good/fair/poor:19665} compliance with treatment. She reports {good/fair/poor:18685} tolerance of treatment. She {is/is not:21021397} having side effects. {document side effects if present:1}  She feels her anxiety is {Desc; severity:60313} and {improved/worse/unchanged:3041574} since last visit.  Symptoms: {Yes/No:20286} chest pain {Yes/No:20286} difficulty concentrating  {Yes/No:20286} dizziness {Yes/No:20286} fatigue  {Yes/No:20286} feelings of losing control {Yes/No:20286} insomnia  {Yes/No:20286} irritable {Yes/No:20286} palpitations  {Yes/No:20286} panic attacks {Yes/No:20286} racing thoughts  {Yes/No:20286} shortness of breath {Yes/No:20286} sweating  {Yes/No:20286} tremors/shakes    GAD-7 Results No flowsheet data found.  PHQ-9 Scores PHQ9 SCORE ONLY 05/22/2020 07/12/2019 05/19/2019  PHQ-9 Total Score 17 3 10      {Show patient history (optional):23778::" "}   Medications: Outpatient Medications Prior to Visit  Medication Sig  . albuterol (VENTOLIN HFA) 108 (90 Base) MCG/ACT inhaler Inhale 2 puffs into the lungs every 4 (four) hours as needed for wheezing or shortness of breath.  Marland Kitchen azelastine (ASTELIN) 0.1 % nasal spray SMARTSIG:1 Spray(s) Both Nares Twice Daily PRN  . DULoxetine (CYMBALTA) 20 MG capsule TAKE 1 CAPSULE BY MOUTH EVERY DAY  . EPINEPHrine 0.3 mg/0.3 mL IJ SOAJ injection Inject 0.3 mLs (0.3 mg total) into the muscle as needed for anaphylaxis.  . fluticasone (FLONASE) 50 MCG/ACT nasal spray Place into both nostrils.  . meloxicam (MOBIC) 15 MG tablet TAKE 1 TABLET BY  MOUTH EVERY DAY  . montelukast (SINGULAIR) 10 MG tablet TAKE 1 TABLET BY MOUTH EVERYDAY AT BEDTIME  . Multiple Vitamin (MULTIVITAMIN WITH MINERALS) TABS tablet Take 1 tablet by mouth daily.  . nortriptyline (PAMELOR) 50 MG capsule Take 50 mg by mouth at bedtime.  . promethazine (PHENERGAN) 12.5 MG tablet Take 1 tablet (12.5 mg total) by mouth every 8 (eight) hours as needed for nausea or vomiting.  . rizatriptan (MAXALT) 10 MG tablet Take 10 mg by mouth 2 (two) times daily as needed.  Marland Kitchen tiZANidine (ZANAFLEX) 2 MG tablet TAKE 1 CAPSULE (2 MG TOTAL) BY MOUTH AT BEDTIME.   No facility-administered medications prior to visit.    Review of Systems  {Heme  Chem  Endocrine  Serology  Results Review (optional):23779::" "}  Objective    There were no vitals taken for this visit. {Show previous vital signs (optional):23777::" "}  Physical Exam  ***  No results found for any visits on 06/19/20.  Assessment & Plan     ***  No follow-ups on file.      {provider attestation***:1}   Paulene Floor  [facility] [phone] (phone) [phone] (fax)  [place]

## 2020-06-18 NOTE — Patient Instructions (Addendum)
Thank you allowing the Chronic Care Management Team to be a part of your care! It was a pleasure speaking with you today!  1. Please follow up and schedule with the therapist from the [place] (Chronic Care Management) Team   Hughes Better  RN, BSN Nurse Care Coordinator  [phone]   [place], [place] Social Worker [phone]  Goals Addressed            This Visit's Progress   . Begin and Stick with Counseling       Follow Up Date 06/18/2020   - check out counseling - keep 90 percent of counseling appointments - schedule counseling appointment    Why is this important?   Beating depression may take some time.  If you don't feel better right away, don't give up on your treatment plan.    Notes: Phone call to patient to follow up on referral to outpatient counseling. Per patient, she did receive a call and has a scheduled intake appointment today, however it will need to be re-scheduled due to a conflict. Patient agrees to re-scheduled appointment today.        The patient verbalized understanding of instructions provided today and declined a print copy of patient instruction materials.   Telephone follow up appointment with care management team member scheduled for:  07/10/20

## 2020-06-19 ENCOUNTER — Ambulatory Visit: Payer: Self-pay | Admitting: Physician Assistant

## 2020-06-20 ENCOUNTER — Other Ambulatory Visit: Payer: Self-pay

## 2020-06-20 ENCOUNTER — Ambulatory Visit
Admission: EM | Admit: 2020-06-20 | Discharge: 2020-06-20 | Disposition: A | Discharge status: Home / Self Care | Payer: Medicaid Other | Attending: Emergency Medicine | Admitting: Emergency Medicine

## 2020-06-20 ENCOUNTER — Ambulatory Visit (INDEPENDENT_AMBULATORY_CARE_PROVIDER_SITE_OTHER): Payer: Medicaid Other

## 2020-06-20 DIAGNOSIS — W540XXA Bitten by dog, initial encounter: Secondary | ICD-10-CM | POA: Diagnosis not present

## 2020-06-20 DIAGNOSIS — M79644 Pain in right finger(s): Secondary | ICD-10-CM | POA: Diagnosis not present

## 2020-06-20 DIAGNOSIS — S61451A Open bite of right hand, initial encounter: Secondary | ICD-10-CM | POA: Diagnosis not present

## 2020-06-20 DIAGNOSIS — Z23 Encounter for immunization: Secondary | ICD-10-CM

## 2020-06-20 MED ORDER — TETANUS-DIPHTH-ACELL PERTUSSIS 5-2.5-18.5 LF-MCG/0.5 IM SUSY
0.5000 mL | PREFILLED_SYRINGE | Freq: Once | INTRAMUSCULAR | Status: AC
  Administered 2020-06-20: 0.5 mL via INTRAMUSCULAR

## 2020-06-20 MED ORDER — IBUPROFEN 600 MG PO TABS
600.0000 mg | ORAL_TABLET | Freq: Four times a day (QID) | ORAL | 0 refills | Status: DC | PRN
Start: 2020-06-20 — End: 2020-12-15

## 2020-06-20 MED ORDER — AMOXICILLIN-POT CLAVULANATE 875-125 MG PO TABS
1.0000 | ORAL_TABLET | Freq: Two times a day (BID) | ORAL | 0 refills | Status: DC
Start: 2020-06-20 — End: 2020-06-20

## 2020-06-20 MED ORDER — AMOXICILLIN-POT CLAVULANATE 875-125 MG PO TABS: 1.0000 | ORAL_TABLET | Freq: Two times a day (BID) | ORAL | 0 refills | Status: AC

## 2020-06-20 NOTE — ED Triage Notes (Signed)
Pt reports her 32 week old puppy was riding in the car and chewing on paper. She went to take it away and the puppy bit her and caused a laceration to her right hand 4th finger pad.

## 2020-06-20 NOTE — ED Provider Notes (Signed)
HPI  SUBJECTIVE:  Dominique Navarro is a right-handed 29 y.o. female who presents with a dog bite to her right ring finger.  She states that she was trying to pull a piece of paper straw wrapper away from her puppy, and it bit her on the distal right ring finger pad.  She states she pulled her hand away and it sliced her finger open.  She reports bleeding, mild pain at the site.  She denies joint involvement.  She reports erythema, purulent drainage, swelling.  No foreign body sensation.  She states that the puppy is almost 71 weeks old and all of its immunizations are up-to-date.  She tried peroxide and rinsing it out with water without improvement in symptoms.  Symptoms are worse with palpation.  She is not a diabetic or smoker.  Her tetanus is not up-to-date.  LMP: 2014.  Denies possibility being pregnant.  [id number], Wendee Beavers, PA-C   Past Medical History:  Diagnosis Date  . Depression   . Migraine     Past Surgical History:  Procedure Laterality Date  . CYST REMOVAL NECK      Family History  Problem Relation Age of Onset  . Asthma Mother   . COPD Mother   . COPD Father   . Seizures Son     Social History   Tobacco Use  . Smoking status: Former Smoker    Types: Cigarettes    Quit date: 02/23/2019    Years since quitting: 1.3  . Smokeless tobacco: Never Used  Vaping Use  . Vaping Use: Never used  Substance Use Topics  . Alcohol use: Never  . Drug use: Never    No current facility-administered medications for this encounter.  Current Outpatient Medications:  .  albuterol (VENTOLIN HFA) 108 (90 Base) MCG/ACT inhaler, Inhale 2 puffs into the lungs every 4 (four) hours as needed for wheezing or shortness of breath., Disp: 18 g, Rfl: 0 .  amoxicillin-clavulanate (AUGMENTIN) 875-125 MG tablet, Take 1 tablet by mouth 2 (two) times daily for 5 days., Disp: 10 tablet, Rfl: 0 .  azelastine (ASTELIN) 0.1 % nasal spray, SMARTSIG:1 Spray(s) Both Nares Twice Daily PRN, Disp: , Rfl:  .   DULoxetine (CYMBALTA) 20 MG capsule, TAKE 1 CAPSULE BY MOUTH EVERY DAY, Disp: 90 capsule, Rfl: 1 .  EPINEPHrine 0.3 mg/0.3 mL IJ SOAJ injection, Inject 0.3 mLs (0.3 mg total) into the muscle as needed for anaphylaxis., Disp: 1 each, Rfl: 0 .  fluticasone (FLONASE) 50 MCG/ACT nasal spray, Place into both nostrils., Disp: , Rfl:  .  ibuprofen (ADVIL) 600 MG tablet, Take 1 tablet (600 mg total) by mouth every 6 (six) hours as needed., Disp: 20 tablet, Rfl: 0 .  montelukast (SINGULAIR) 10 MG tablet, TAKE 1 TABLET BY MOUTH EVERYDAY AT BEDTIME, Disp: 90 tablet, Rfl: 3 .  Multiple Vitamin (MULTIVITAMIN WITH MINERALS) TABS tablet, Take 1 tablet by mouth daily., Disp: , Rfl:  .  nortriptyline (PAMELOR) 50 MG capsule, Take 50 mg by mouth at bedtime., Disp: , Rfl:  .  promethazine (PHENERGAN) 12.5 MG tablet, Take 1 tablet (12.5 mg total) by mouth every 8 (eight) hours as needed for nausea or vomiting., Disp: 20 tablet, Rfl: 1 .  rizatriptan (MAXALT) 10 MG tablet, Take 10 mg by mouth 2 (two) times daily as needed., Disp: , Rfl:  .  tiZANidine (ZANAFLEX) 2 MG tablet, TAKE 1 CAPSULE (2 MG TOTAL) BY MOUTH AT BEDTIME., Disp: 30 tablet, Rfl: 1  Allergies  Allergen Reactions  .  Sulfur Hives and Other (See Comments)    Other reaction(s): Other (See Comments) Other reaction(s): Other (See Comments)  . Bee Venom Hives  . Sulfa Antibiotics Hives  . Topiramate Palpitations     ROS  As noted in HPI.   Physical Exam  BP 126/81 (BP Location: Left Arm)   Pulse (!) 112   Temp 98.9 F (37.2 C) (Oral)   Resp 16   Ht 5\' 4"  (1.626 m)   Wt 74.9 kg   SpO2 100%   BMI 28.34 kg/m   Constitutional: Well developed, well nourished, no acute distress Eyes:  EOMI, conjunctiva normal bilaterally HENT: Normocephalic, atraumatic,mucus membranes moist Respiratory: Normal inspiratory effort Cardiovascular: Normal rate GI: nondistended skin: No rash, skin intact Musculoskeletal: 1 cm clean linear laceration to distal  finger pad right ring finger.  Explored wound with adequate hemostasis, no foreign body visualized.  Positive localized tenderness.  No erythema, swelling, expressible purulent drainage.  Patient able to bend FDP/FDS against resistance.  Sensation grossly intact in the area.      Neurologic: Alert & oriented x 3, no focal neuro deficits Psychiatric: Speech and behavior appropriate   ED Course   Medications  Tdap (BOOSTRIX) injection 0.5 mL (0.5 mLs Intramuscular Given 06/20/20 1646)    Orders Placed This Encounter  Procedures  . DG Finger Ring Right    Standing Status:   Standing    Number of Occurrences:   1    Order Specific Question:   Reason for Exam (SYMPTOM  OR DIAGNOSIS REQUIRED)    Answer:   dog bite r/o retained FB    No results found for this or any previous visit (from the past 24 hour(s)). DG Finger Ring Right  Result Date: 06/20/2020 CLINICAL DATA:  Dog bite EXAM: RIGHT RING FINGER 2+V COMPARISON:  None. FINDINGS: There is no evidence of fracture or dislocation. There is no evidence of arthropathy or other focal bone abnormality. Soft tissues are unremarkable. IMPRESSION: Negative. Electronically Signed   By: Donavan Foil M.D.   On: 06/20/2020 17:46    ED Clinical Impression  1. Dog bite of right hand, initial encounter      ED Assessment/Plan  Updating tetanus.  Washed wound extensively with soap and irrigated copiously with tap water.  Placed bacitracin and a Band-Aid.    Will not close because it is on the hand.  Will get x-ray to rule out any retained foreign body as I am sure as to how deep the wound truly is.  Reviewed imaging independently.  No foreign body. see radiology report for full details.  Will send home with Augmentin, Tylenol/ibuprofen.  Follow-up for wound check with an urgent care center in a day or 2 if not getting better.  Discussed the unpredictable nature of bites to the hand.  She is going to the [facility].  Discussed imaging,  MDM, treatment plan, and plan for follow-up with patient. Discussed sn/sx that should prompt return to the ED. patient agrees with plan.   Meds ordered this encounter  Medications  . Tdap (BOOSTRIX) injection 0.5 mL  . DISCONTD: amoxicillin-clavulanate (AUGMENTIN) 875-125 MG tablet    Sig: Take 1 tablet by mouth 2 (two) times daily for 10 days.    Dispense:  20 tablet    Refill:  0  . ibuprofen (ADVIL) 600 MG tablet    Sig: Take 1 tablet (600 mg total) by mouth every 6 (six) hours as needed.    Dispense:  20 tablet  Refill:  0  . amoxicillin-clavulanate (AUGMENTIN) 875-125 MG tablet    Sig: Take 1 tablet by mouth 2 (two) times daily for 5 days.    Dispense:  10 tablet    Refill:  0    *This clinic note was created using [employer]. Therefore, there may be occasional mistakes despite careful proofreading.   ?    Melynda Ripple, MD 06/21/20 1530

## 2020-06-20 NOTE — Discharge Instructions (Addendum)
no foreign body seen on x-ray.  Keep clean and dry for the next 48 hours.  Then you may take the Band-Aid off and give it some dry time.  Finish the Augmentin.  600 mg ibuprofen combined with 1000 milligrams Tylenol together 3-4 times a day as needed for pain.  Ice your finger.

## 2020-07-10 ENCOUNTER — Telehealth: Payer: Self-pay

## 2020-07-10 ENCOUNTER — Other Ambulatory Visit: Payer: Self-pay | Admitting: Physician Assistant

## 2020-07-10 DIAGNOSIS — M545 Low back pain, unspecified: Secondary | ICD-10-CM

## 2020-07-10 NOTE — Telephone Encounter (Signed)
Requested medication (s) are due for refill today: no  Requested medication (s) are on the active medication list: yes  Last refill:  06/15/20  Future visit scheduled: yes  Notes to clinic:  NT not delegated to RF or refuse this med   Requested Prescriptions  Pending Prescriptions Disp Refills   tiZANidine (ZANAFLEX) 2 MG tablet [Pharmacy Med Name: TIZANIDINE HCL 2 MG TABLET] 30 tablet 1    Sig: TAKE 1 CAPSULE (2 MG TOTAL) BY MOUTH AT BEDTIME.      Not Delegated - Cardiovascular:  Alpha-2 Agonists - tizanidine Failed - 07/10/2020 10:03 AM      Failed - This refill cannot be delegated      Passed - Valid encounter within last 6 months    Recent Outpatient Visits           1 month ago Nexplanon in place   [place], [place], PA-C   5 months ago Rapid palpitations   [facility] [place], [place]   6 months ago Encounter for initial prescription of [place], Dionne Bucy, [place]   6 months ago Bilateral carpal tunnel syndrome   [place], [place], [place]   10 months ago Other migraine without status migrainosus, not intractable   [place], Wendee Beavers, [place]       Future Appointments             In 2 weeks Trinna Post, PA-C [employer] [employer], [place]

## 2020-07-12 ENCOUNTER — Telehealth: Payer: Self-pay | Admitting: *Deleted

## 2020-07-12 NOTE — Telephone Encounter (Signed)
    Care Management   Unsuccessful Call Note 49/49/4473 Name: Dominique Navarro MRN: [id number] DOB: [birth year]  Patient  is a 29 year old female who sees Carles Collet, [place] for primary care. Carles Collet, PA-C asked the CCM team to consult the patient for Mental Health Counseling and Resources.   This social worker was unable to reach patient via telephone today for follow up call regarding mental health resources provided. I have left HIPAA compliant voicemail asking patient to return my call. (unsuccessful outreach #1).   Plan: Will follow-up within 7 business days via telephone.      Elliot Gurney, [place] Worker  [facility] [phone]

## 2020-07-26 ENCOUNTER — Ambulatory Visit: Payer: Self-pay | Admitting: *Deleted

## 2020-07-26 DIAGNOSIS — F419 Anxiety disorder, unspecified: Secondary | ICD-10-CM

## 2020-07-26 DIAGNOSIS — F32A Depression, unspecified: Secondary | ICD-10-CM

## 2020-07-26 NOTE — Chronic Care Management (AMB) (Signed)
Care Management    Clinical Social Work Follow Up Note  63/03/7563 Name: Dominique Navarro MRN: [id number] DOB: [birth year]  Dominique Navarro is a 29 y.o. year old female who is a primary care patient of Paulene Floor. The CCM team was consulted for assistance with Mental Health Counseling and Resources.   Review of patient status, including review of consultants reports, other relevant assessments, and collaboration with appropriate care team members and the patient's provider was performed as part of comprehensive patient evaluation and provision of chronic care management services.    SDOH (Social Determinants of Health) assessments performed: No    Outpatient Encounter Medications as of 07/26/2020  Medication Sig  . albuterol (VENTOLIN HFA) 108 (90 Base) MCG/ACT inhaler Inhale 2 puffs into the lungs every 4 (four) hours as needed for wheezing or shortness of breath.  Marland Kitchen azelastine (ASTELIN) 0.1 % nasal spray SMARTSIG:1 Spray(s) Both Nares Twice Daily PRN  . DULoxetine (CYMBALTA) 20 MG capsule TAKE 1 CAPSULE BY MOUTH EVERY DAY  . EPINEPHrine 0.3 mg/0.3 mL IJ SOAJ injection Inject 0.3 mLs (0.3 mg total) into the muscle as needed for anaphylaxis.  . fluticasone (FLONASE) 50 MCG/ACT nasal spray Place into both nostrils.  Marland Kitchen ibuprofen (ADVIL) 600 MG tablet Take 1 tablet (600 mg total) by mouth every 6 (six) hours as needed.  . montelukast (SINGULAIR) 10 MG tablet TAKE 1 TABLET BY MOUTH EVERYDAY AT BEDTIME  . Multiple Vitamin (MULTIVITAMIN WITH MINERALS) TABS tablet Take 1 tablet by mouth daily.  . nortriptyline (PAMELOR) 50 MG capsule Take 50 mg by mouth at bedtime.  . promethazine (PHENERGAN) 12.5 MG tablet Take 1 tablet (12.5 mg total) by mouth every 8 (eight) hours as needed for nausea or vomiting.  . rizatriptan (MAXALT) 10 MG tablet Take 10 mg by mouth 2 (two) times daily as needed.  Marland Kitchen tiZANidine (ZANAFLEX) 2 MG tablet TAKE 1 CAPSULE (2 MG TOTAL) BY MOUTH AT BEDTIME.  . [DISCONTINUED]  amitriptyline (ELAVIL) 25 MG tablet Take 1 tablet (25 mg total) by mouth at bedtime.   No facility-administered encounter medications on file as of 07/26/2020.     Goals Addressed            This Visit's Progress   . Begin and Stick with Counseling       Follow Up Date 08/09/2020   - check out counseling - keep 90 percent of counseling appointments - schedule counseling appointment    Why is this important?   Beating depression may take some time.  If you don't feel better right away, don't give up on your treatment plan.    Notes: Phone call to patient to follow up on referral to outpatient counseling. Per patient, she did receive a call and has a scheduled intake appointment today, however it will need to be re-scheduled due to a conflict. Patient has not rescheduled appointment and has requested the contact information to do so. Contacted information provided    . Find Help in My Community       Follow Up Date 08/09/2020   - follow-up on any referrals for help I am given - have a back-up plan    Why is this important?   Knowing how and where to find help for yourself or family in your neighborhood and community is an important skill.  You will want to take some steps to learn how.    Notes: patient provided with the contact number for the [facility] to re-schedule her  follow up appointment. Patient will contact this social worker with any issues with scheduling or if additional resources are needed    . Manage My Emotions       Follow Up Date 08/09/2020   - begin personal counseling - talk about feelings with a friend, family or spiritual advisor - practice positive thinking and self-talk           Follow Up Plan: SW will follow up with patient by phone over the next 14 business days    [place], [place] Worker  [facility] [phone]

## 2020-07-26 NOTE — Patient Instructions (Signed)
Thank you allowing the Chronic Care Management Team to be a part of your care! It was a pleasure speaking with you today!  1. Please call this social worker with any questions or concerns regarding your mental health needs  CCM (Chronic Care Management) Team   Neldon Labella RN, BSN Nurse Care Coordinator  [phone]  [place], LCSW Clinical Social Worker [phone]  Goals Addressed            This Visit's Progress   . Begin and Stick with Counseling       Follow Up Date 08/09/2020   - check out counseling - keep 90 percent of counseling appointments - schedule counseling appointment    Why is this important?   Beating depression may take some time.  If you don't feel better right away, don't give up on your treatment plan.    Notes: Phone call to patient to follow up on referral to outpatient counseling. Per patient, she did receive a call and has a scheduled intake appointment today, however it will need to be re-scheduled due to a conflict. Patient has not rescheduled appointment and has requested the contact information to do so. Contacted information provided    . Find Help in My Community       Follow Up Date 08/09/2020   - follow-up on any referrals for help I am given - have a back-up plan    Why is this important?   Knowing how and where to find help for yourself or family in your neighborhood and community is an important skill.  You will want to take some steps to learn how.    Notes: patient provided with the contact number for the [facility] to re-schedule her follow up appointment. Patient will contact this social worker with any issues with scheduling or if additional resources are needed    . Manage My Emotions       Follow Up Date 08/09/2020   - begin personal counseling - talk about feelings with a friend, family or spiritual advisor - practice positive thinking and self-talk           The patient verbalized understanding  of instructions, educational materials, and care plan provided today and declined offer to receive copy of patient instructions, educational materials, and care plan.   Telephone follow up appointment with care management team member scheduled for:08/09/20

## 2020-07-30 ENCOUNTER — Encounter: Payer: Medicaid Other | Admitting: Physician Assistant

## 2020-07-30 ENCOUNTER — Telehealth: Payer: Self-pay | Admitting: Physician Assistant

## 2020-07-30 NOTE — Telephone Encounter (Signed)
No show warning letter sent.  

## 2020-08-08 ENCOUNTER — Other Ambulatory Visit: Payer: Self-pay | Admitting: Physician Assistant

## 2020-08-08 DIAGNOSIS — M545 Low back pain, unspecified: Secondary | ICD-10-CM

## 2020-08-08 NOTE — Telephone Encounter (Signed)
Requested medication (s) are due for refill today: No  Requested medication (s) are on the active medication list: Yes  Last refill:  07/11/20 # 30 with 1 refill  Future visit scheduled: Yes  Notes to clinic:  See request.    Requested Prescriptions  Pending Prescriptions Disp Refills   tiZANidine (ZANAFLEX) 2 MG tablet [Pharmacy Med Name: TIZANIDINE HCL 2 MG TABLET] 30 tablet 1    Sig: TAKE 1 CAPSULE (2 MG TOTAL) BY MOUTH AT BEDTIME.      Not Delegated - Cardiovascular:  Alpha-2 Agonists - tizanidine Failed - 08/08/2020  9:32 AM      Failed - This refill cannot be delegated      Passed - Valid encounter within last 6 months    Recent Outpatient Visits           2 months ago Nexplanon in place   [place], [place], PA-C   6 months ago Rapid palpitations   [facility] Mar [place], [place]   7 months ago Encounter for initial prescription of [place], Dionne Bucy, MD   7 months ago Bilateral carpal tunnel syndrome   [place], [place], [place]   11 months ago Other migraine without status migrainosus, not intractable   [place], [place], [place]

## 2020-08-09 ENCOUNTER — Telehealth: Payer: Self-pay

## 2020-08-10 ENCOUNTER — Ambulatory Visit: Payer: Self-pay | Admitting: *Deleted

## 2020-08-10 DIAGNOSIS — R41840 Attention and concentration deficit: Secondary | ICD-10-CM

## 2020-08-10 DIAGNOSIS — F419 Anxiety disorder, unspecified: Secondary | ICD-10-CM

## 2020-08-10 NOTE — Chronic Care Management (AMB) (Signed)
Care Management    Clinical Social Work Follow Up Note  78/29/5621 Name: Dominique Navarro MRN: [id number] DOB: [birth year]  Dominique Navarro is a 29 y.o. year old female who is a primary care patient of Paulene Floor. The CCM team was consulted for assistance with Mental Health Counseling and Resources.   Review of patient status, including review of consultants reports, other relevant assessments, and collaboration with appropriate care team members and the patient's provider was performed as part of comprehensive patient evaluation and provision of chronic care management services.    SDOH (Social Determinants of Health) assessments performed: No    Outpatient Encounter Medications as of 08/10/2020  Medication Sig  . albuterol (VENTOLIN HFA) 108 (90 Base) MCG/ACT inhaler Inhale 2 puffs into the lungs every 4 (four) hours as needed for wheezing or shortness of breath.  Marland Kitchen azelastine (ASTELIN) 0.1 % nasal spray SMARTSIG:1 Spray(s) Both Nares Twice Daily PRN  . DULoxetine (CYMBALTA) 20 MG capsule TAKE 1 CAPSULE BY MOUTH EVERY DAY  . EPINEPHrine 0.3 mg/0.3 mL IJ SOAJ injection Inject 0.3 mLs (0.3 mg total) into the muscle as needed for anaphylaxis.  . fluticasone (FLONASE) 50 MCG/ACT nasal spray Place into both nostrils.  Marland Kitchen ibuprofen (ADVIL) 600 MG tablet Take 1 tablet (600 mg total) by mouth every 6 (six) hours as needed.  . montelukast (SINGULAIR) 10 MG tablet TAKE 1 TABLET BY MOUTH EVERYDAY AT BEDTIME  . Multiple Vitamin (MULTIVITAMIN WITH MINERALS) TABS tablet Take 1 tablet by mouth daily.  . nortriptyline (PAMELOR) 50 MG capsule Take 50 mg by mouth at bedtime.  . promethazine (PHENERGAN) 12.5 MG tablet Take 1 tablet (12.5 mg total) by mouth every 8 (eight) hours as needed for nausea or vomiting.  . rizatriptan (MAXALT) 10 MG tablet Take 10 mg by mouth 2 (two) times daily as needed.  Marland Kitchen tiZANidine (ZANAFLEX) 2 MG tablet TAKE 1 CAPSULE (2 MG TOTAL) BY MOUTH AT BEDTIME.  . [DISCONTINUED]  amitriptyline (ELAVIL) 25 MG tablet Take 1 tablet (25 mg total) by mouth at bedtime.   No facility-administered encounter medications on file as of 08/10/2020.     Patient Care Plan: General Plan of Care (Adult)    Problem Identified: Coping Skills (General Plan of Care)   Priority: High    Goal: Depressive Symptoms Identified Completed 08/10/2020  Start Date: 05/31/2020  Expected End Date: 08/10/2020  Priority: High  Note:   Current Barriers:  Marland Kitchen Mental Health Concerns  . Patient unable to identify in-network mental health providers  Clinical Social Work Clinical Goal(s):  Marland Kitchen Over the next 90 days, patient will work with SW to address concerns related to need for ongoing mental health therapy  Interventions: . 1:1 collaboration with Trinna Post, PA-C regarding development and update of comprehensive plan of care as evidenced by provider attestation and co-signature . Inter-disciplinary care team collaboration (see longitudinal plan of care) . Patient previously referred to the [facility] for ongoing mental health therapy . Confirmed today with patient that she has a face to face appointment today at 4pm . Positive reinforcement provided for follow through with the scheduling and keeping the initial appointment.  Patient Goals/Self-Care Activities Over the next 30 days, patient will:  - check out counseling - keep 90 percent of counseling appointments - schedule counseling appointment  -   Follow up Plan: Client will continue ongoing therapy with the [facility]    Task: Identify Depressive Symptoms and Facilitate Treatment Completed 08/10/2020  Due Date:  07/02/2020  Priority: Routine  Note:   Care Management Activities:    - mental health treatment arranged - participation in psychiatric services encouraged    Notes:    Problem Identified: Therapeutic Alliance (General Plan of Care)   Priority: High    Patient Care Plan: Depression (Adult)     Problem Identified: Depression Identification (Depression)   Priority: High    Problem Identified: Symptoms (Depression)   Priority: High    Patient Care Plan: Depression (Adult)    Problem Identified: Depression Identification (Depression)     Problem Identified: Symptoms (Depression)     Goal: Symptoms Monitored and Managed   Start Date: 06/11/2020  Expected End Date: 09/11/2020  This Visit's Progress: On track  Priority: High  Note:   Current Barriers:  Marland Kitchen Mental Health Concerns  . Knowledge deficit of local mental health providers to address mental health concerns  Clinical Social Work Clinical Goal(s):  Marland Kitchen Over the next 90 days, patient will work with SW to address concerns related to depressive symptoms related to relationship conflicts  Interventions: . 1:1 collaboration with Trinna Post, PA-C regarding development and update of comprehensive plan of care as evidenced by provider attestation and co-signature . Inter-disciplinary care team collaboration (see longitudinal plan of care) . Patient discussed relationships concerns affecting her mental health . Patient previously referred to the [facility] for ongoing [place] . Patient confirmed today that her initial appointment was scheduled for today at 4pm . Positive reinforcement provided for the scheduling and keeping of her appointment to meet her ongoing therapy needs . Patient verbalized having no further community resource needs at this time . Patient encouraged to contact this social worker if any further need arise.  Patient Goals/Self-Care Activities Over the next 90 days, patient will:  - check out counseling - keep 90 percent of counseling appointments - schedule counseling appointment  -   Follow up Plan: Client will contact this social worker of any further community resource needs arise    Task: Alleviate Barriers to Depression Treatment   Note:   Care Management  Activities:    - emotional support provided - participation in mental health treatment encouraged    Notes:    Problem Identified: Response to Treatment (Depression)         Follow Up Plan: Client will contact this social worker with any additional community resource needs    [place], [place] Worker  [place] Care Management [phone]

## 2020-08-10 NOTE — Patient Instructions (Signed)
Thank you allowing the Chronic Care Management Team to be a part of your care! It was a pleasure speaking with you today!  1. Please follow up with the [facility] today at [place] (Chronic Care Management) Team   Neldon Labella RN, BSN Nurse Care Coordinator  [phone]  [place], LCSW Clinical Social Worker [phone]  Patient Care Plan: General Plan of Care (Adult)    Problem Identified: Coping Skills (General Plan of Care)   Priority: High    Goal: Depressive Symptoms Identified Completed 08/10/2020  Start Date: 05/31/2020  Expected End Date: 08/10/2020  Priority: High  Note:   Current Barriers:   Mental Health Concerns   Patient unable to identify in-network mental health providers  Clinical Social Work Clinical Goal(s):   Over the next 90 days, patient will work with SW to address concerns related to need for ongoing mental health therapy  Interventions:  1:1 collaboration with Trinna Post, PA-C regarding development and update of comprehensive plan of care as evidenced by provider attestation and co-signature  Inter-disciplinary care team collaboration (see longitudinal plan of care)  Patient previously referred to the [facility] for ongoing mental health therapy  Confirmed today with patient that she has a face to face appointment today at 4pm  Positive reinforcement provided for follow through with the scheduling and keeping the initial appointment.  Patient Goals/Self-Care Activities Over the next 30 days, patient will:  - check out counseling - keep 90 percent of counseling appointments - schedule counseling appointment  -   Follow up Plan: Client will continue ongoing therapy with the [facility]    Task: Identify Depressive Symptoms and Facilitate Treatment Completed 08/10/2020  Due Date: 07/02/2020  Priority: Routine  Note:   Care Management Activities:    - mental health treatment arranged - participation  in psychiatric services encouraged    Notes:    Problem Identified: Therapeutic Alliance (General Plan of Care)   Priority: High    Patient Care Plan: Depression (Adult)    Problem Identified: Depression Identification (Depression)   Priority: High    Problem Identified: Symptoms (Depression)   Priority: High    Patient Care Plan: Depression (Adult)    Problem Identified: Depression Identification (Depression)     Problem Identified: Symptoms (Depression)     Goal: Symptoms Monitored and Managed   Start Date: 06/11/2020  Expected End Date: 09/11/2020  This Visit's Progress: On track  Priority: High  Note:   Current Barriers:   Mental Health Concerns   Knowledge deficit of local mental health providers to address mental health concerns  Clinical Social Work Clinical Goal(s):   Over the next 90 days, patient will work with SW to address concerns related to depressive symptoms related to relationship conflicts  Interventions:  1:1 collaboration with Trinna Post, PA-C regarding development and update of comprehensive plan of care as evidenced by provider attestation and co-signature  Inter-disciplinary care team collaboration (see longitudinal plan of care)  Patient discussed relationships concerns affecting her mental health  Patient previously referred to the [facility] for ongoing [place]  Patient confirmed today that her initial appointment was scheduled for today at 4pm  Positive reinforcement provided for the scheduling and keeping of her appointment to meet her ongoing therapy needs  Patient verbalized having no further community resource needs at this time  Patient encouraged to contact this social worker if any further need arise.  Patient Goals/Self-Care Activities Over the next 90  days, patient will:  - check out counseling - keep 90 percent of counseling appointments - schedule counseling appointment  -   Follow up  Plan: Client will contact this social worker of any further community resource needs arise    Task: Alleviate Barriers to Depression Treatment   Note:   Care Management Activities:    - emotional support provided - participation in mental health treatment encouraged    Notes:    Problem Identified: Response to Treatment (Depression)         The patient verbalized understanding of instructions, educational materials, and care plan provided today and declined offer to receive copy of patient instructions, educational materials, and care plan.   No further follow up required: Patient to followup with the [facility] for ongoing mental health counseling

## 2020-08-28 ENCOUNTER — Other Ambulatory Visit: Payer: Self-pay | Admitting: Physician Assistant

## 2020-08-28 ENCOUNTER — Ambulatory Visit
Admission: EM | Admit: 2020-08-28 | Discharge: 2020-08-28 | Disposition: A | Discharge status: Home / Self Care | Payer: Medicaid Other

## 2020-08-28 DIAGNOSIS — M545 Low back pain, unspecified: Secondary | ICD-10-CM

## 2020-08-28 NOTE — Telephone Encounter (Signed)
   Notes to clinic:  requesting a 90 day This refill cannot be delegated    Requested Prescriptions  Pending Prescriptions Disp Refills   tiZANidine (ZANAFLEX) 2 MG tablet [Pharmacy Med Name: TIZANIDINE HCL 2 MG TABLET] 90 tablet 1    Sig: TAKE 1 CAPSULE BY MOUTH AT BEDTIME.      Not Delegated - Cardiovascular:  Alpha-2 Agonists - tizanidine Failed - 08/28/2020  2:38 PM      Failed - This refill cannot be delegated      Passed - Valid encounter within last 6 months    Recent Outpatient Visits           3 months ago Nexplanon in place   [facility] [place], [place], PA-C   6 months ago Rapid palpitations   [facility] Margaretann [place], [place]   8 months ago Encounter for initial prescription of Nexplanon   [facility] [place], Marzella [place]lein, MD   8 months ago Bilateral carpal tunnel syndrome   [facility] Osvaldo Angst M, [place]   12 months ago Other migraine without status migrainosus, not intractable   [facility] [place], [place], [place]

## 2020-09-19 NOTE — Progress Notes (Deleted)
Trinna Post, PA-C   No chief complaint on file.   HPI:      [name] is a 30 y.o. [id number] whose LMP was No LMP recorded. (Menstrual status: Irregular Periods)., presents today for ***  Nexplanon removed 10/21, placed 4/21 by PCP. Went directly to nexplanon from many yrs of depo  Past Medical History:  Diagnosis Date  . Depression   . Migraine     Past Surgical History:  Procedure Laterality Date  . CYST REMOVAL NECK      Family History  Problem Relation Age of Onset  . Asthma Mother   . COPD Mother   . COPD Father   . Seizures Son     Social History   Socioeconomic History  . Marital status: Married    Spouse name: Not on file  . Number of children: Not on file  . Years of education: Not on file  . Highest education level: Not on file  Occupational History  . Not on file  Tobacco Use  . Smoking status: Former Smoker    Types: Cigarettes    Quit date: 02/23/2019    Years since quitting: 1.5  . Smokeless tobacco: Never Used  Vaping Use  . Vaping Use: Never used  Substance and Sexual Activity  . Alcohol use: Never  . Drug use: Never  . Sexual activity: Not Currently    Birth control/protection: Implant  Other Topics Concern  . Not on file  Social History Narrative  . Not on file   Social Determinants of Health   Financial Resource Strain: Not on file  Food Insecurity: No Food Insecurity  . Worried About Charity fundraiser in the Last Year: Never true  . Ran Out of Food in the Last Year: Never true  Transportation Needs: No Transportation Needs  . Lack of Transportation (Medical): No  . Lack of Transportation (Non-Medical): No  Physical Activity: Not on file  Stress: Stress Concern Present  . Feeling of Stress : To some extent  Social Connections: Not on file  Intimate Partner Violence: Not At Risk  . Fear of Current or Ex-Partner: No  . Emotionally Abused: No  . Physically Abused: No  . Sexually Abused: No    Outpatient  Medications Prior to Visit  Medication Sig Dispense Refill  . albuterol (VENTOLIN HFA) 108 (90 Base) MCG/ACT inhaler Inhale 2 puffs into the lungs every 4 (four) hours as needed for wheezing or shortness of breath. 18 g 0  . azelastine (ASTELIN) 0.1 % nasal spray SMARTSIG:1 Spray(s) Both Nares Twice Daily PRN    . DULoxetine (CYMBALTA) 20 MG capsule TAKE 1 CAPSULE BY MOUTH EVERY DAY 90 capsule 1  . EPINEPHrine 0.3 mg/0.3 mL IJ SOAJ injection Inject 0.3 mLs (0.3 mg total) into the muscle as needed for anaphylaxis. 1 each 0  . fluticasone (FLONASE) 50 MCG/ACT nasal spray Place into both nostrils.    Marland Kitchen ibuprofen (ADVIL) 600 MG tablet Take 1 tablet (600 mg total) by mouth every 6 (six) hours as needed. 20 tablet 0  . montelukast (SINGULAIR) 10 MG tablet TAKE 1 TABLET BY MOUTH EVERYDAY AT BEDTIME 90 tablet 3  . Multiple Vitamin (MULTIVITAMIN WITH MINERALS) TABS tablet Take 1 tablet by mouth daily.    . nortriptyline (PAMELOR) 50 MG capsule Take 50 mg by mouth at bedtime.    . promethazine (PHENERGAN) 12.5 MG tablet Take 1 tablet (12.5 mg total) by mouth every 8 (eight) hours as needed  for nausea or vomiting. 20 tablet 1  . rizatriptan (MAXALT) 10 MG tablet Take 10 mg by mouth 2 (two) times daily as needed.    Marland Kitchen tiZANidine (ZANAFLEX) 2 MG tablet TAKE 1 CAPSULE BY MOUTH AT BEDTIME. 90 tablet 0   No facility-administered medications prior to visit.      ROS:  Review of Systems BREAST: No symptoms   OBJECTIVE:   Vitals:  There were no vitals taken for this visit.  Physical Exam  Results: No results found for this or any previous visit (from the past 24 hour(s)).   Assessment/Plan: No diagnosis found.    No orders of the defined types were placed in this encounter.     No follow-ups on file.  Suzi Hernan B. Shedrick Sarli, PA-C 09/19/2020 3:27 PM

## 2020-09-20 ENCOUNTER — Ambulatory Visit: Payer: Medicaid Other | Admitting: Obstetrics and Gynecology

## 2020-09-21 ENCOUNTER — Other Ambulatory Visit: Payer: Self-pay | Admitting: Physician Assistant

## 2020-09-21 DIAGNOSIS — M2550 Pain in unspecified joint: Secondary | ICD-10-CM

## 2020-09-21 DIAGNOSIS — M791 Myalgia, unspecified site: Secondary | ICD-10-CM

## 2020-09-21 DIAGNOSIS — F32A Depression, unspecified: Secondary | ICD-10-CM

## 2020-09-24 ENCOUNTER — Ambulatory Visit: Payer: Medicaid Other | Admitting: Obstetrics and Gynecology

## 2020-09-28 ENCOUNTER — Encounter: Payer: Self-pay | Admitting: Physician Assistant

## 2020-10-03 ENCOUNTER — Ambulatory Visit: Payer: Medicaid Other | Admitting: Physician Assistant

## 2020-10-03 ENCOUNTER — Other Ambulatory Visit: Payer: Self-pay

## 2020-10-03 ENCOUNTER — Encounter: Payer: Self-pay | Admitting: Physician Assistant

## 2020-10-03 VITALS — BP 109/74 | HR 111 | Temp 98.5°F | Wt 159.6 lb

## 2020-10-03 DIAGNOSIS — G43809 Other migraine, not intractable, without status migrainosus: Secondary | ICD-10-CM

## 2020-10-03 DIAGNOSIS — Z309 Encounter for contraceptive management, unspecified: Secondary | ICD-10-CM | POA: Diagnosis not present

## 2020-10-03 DIAGNOSIS — O99340 Other mental disorders complicating pregnancy, unspecified trimester: Secondary | ICD-10-CM

## 2020-10-03 DIAGNOSIS — L989 Disorder of the skin and subcutaneous tissue, unspecified: Secondary | ICD-10-CM

## 2020-10-03 DIAGNOSIS — F32A Depression, unspecified: Secondary | ICD-10-CM

## 2020-10-03 LAB — POCT URINE PREGNANCY: Preg Test, Ur: NEGATIVE

## 2020-10-03 MED ORDER — MEDROXYPROGESTERONE ACETATE 150 MG/ML IM SUSP
150.0000 mg | Freq: Once | INTRAMUSCULAR | Status: AC
  Administered 2020-10-03: 150 mg via INTRAMUSCULAR

## 2020-10-03 MED ORDER — DULOXETINE HCL 20 MG PO CPEP: 40.0000 mg | ORAL_CAPSULE | Freq: Every day | ORAL | 0 refills | Status: DC

## 2020-10-03 MED ORDER — BUSPIRONE HCL 7.5 MG PO TABS: 7.5000 mg | ORAL_TABLET | Freq: Two times a day (BID) | ORAL | 0 refills | Status: DC

## 2020-10-03 NOTE — Progress Notes (Signed)
Established patient visit   Patient: Dominique Navarro   DOB: [birth year]   30 y.o. Female  MRN: [id number] Visit Date: 10/03/2020  Today's healthcare provider: Trinna Post, PA-C   Chief Complaint  Patient presents with  . Anxiety  I,Dominique Navarro,acting as a scribe for Trinna Post, PA-C.,have documented all relevant documentation on the behalf of Trinna Post, PA-C,as directed by  Trinna Post, PA-C while in the presence of Trinna Post, PA-C.  Subjective    HPI  Anxiety, Follow-up  She was last seen for anxiety 4 months ago. Changes made at last visit include increased cymbalta to 40 mg QD .   She reports good compliance with treatment. She reports good tolerance of treatment. She is not having side effects.   She feels her anxiety is severe and Worse since last visit. She feels that the cymbalta may not be working completely for her and is interested in other medication. Attending counseling through [employer]. Currently going through divorce.   Symptoms: Yes chest pain Yes difficulty concentrating  No dizziness Yes fatigue  Yes feelings of losing control Yes insomnia  Yes irritable Yes palpitations  Yes panic attacks Yes racing thoughts  Yes shortness of breath Yes sweating  Yes tremors/shakes    GAD-7 Results GAD-7 Generalized Anxiety Disorder Screening Tool 10/03/2020  1. Feeling Nervous, Anxious, or on Edge 3  2. Not Being Able to Stop or Control Worrying 2  3. Worrying Too Much About Different Things 2  4. Trouble Relaxing 3  5. Being So Restless it's Hard To Sit Still 3  6. Becoming Easily Annoyed or Irritable 3  7. Feeling Afraid As If Something Awful Might Happen 1  Total GAD-7 Score 17  Difficulty At Work, Home, or Getting  Along With Others? Somewhat difficult    PHQ-9 Scores PHQ9 SCORE ONLY 10/03/2020 05/22/2020 07/12/2019  PHQ-9 Total Score 25 17 3      ---------------------------------------------------------------------------------------------------  She has stopped her birth control, previously on depo and nexplanon. She would like to have more children in the future. She is not currently on birth control but would like to be currently. Does not want to do IUD. Not interested in [facility].      Medications: Outpatient Medications Prior to Visit  Medication Sig  . albuterol (VENTOLIN HFA) 108 (90 Base) MCG/ACT inhaler Inhale 2 puffs into the lungs every 4 (four) hours as needed for wheezing or shortness of breath.  Marland Kitchen azelastine (ASTELIN) 0.1 % nasal spray SMARTSIG:1 Spray(s) Both Nares Twice Daily PRN  . EPINEPHrine 0.3 mg/0.3 mL IJ SOAJ injection Inject 0.3 mLs (0.3 mg total) into the muscle as needed for anaphylaxis.  . fluticasone (FLONASE) 50 MCG/ACT nasal spray Place into both nostrils.  Marland Kitchen ibuprofen (ADVIL) 600 MG tablet Take 1 tablet (600 mg total) by mouth every 6 (six) hours as needed.  . montelukast (SINGULAIR) 10 MG tablet TAKE 1 TABLET BY MOUTH EVERYDAY AT BEDTIME  . Multiple Vitamin (MULTIVITAMIN WITH MINERALS) TABS tablet Take 1 tablet by mouth daily.  . nortriptyline (PAMELOR) 50 MG capsule Take 50 mg by mouth at bedtime.  . promethazine (PHENERGAN) 12.5 MG tablet Take 1 tablet (12.5 mg total) by mouth every 8 (eight) hours as needed for nausea or vomiting.  . rizatriptan (MAXALT) 10 MG tablet Take 10 mg by mouth 2 (two) times daily as needed.  Marland Kitchen tiZANidine (ZANAFLEX) 2 MG tablet TAKE 1 CAPSULE BY MOUTH AT BEDTIME.  . [DISCONTINUED]  DULoxetine (CYMBALTA) 20 MG capsule TAKE 1 CAPSULE BY MOUTH EVERY DAY   No facility-administered medications prior to visit.    Review of Systems  Psychiatric/Behavioral: Positive for agitation, decreased concentration, sleep disturbance and suicidal ideas. Negative for self-injury. The patient is nervous/anxious.        Objective    BP 109/74 (BP Location: Left Arm, Patient Position:  Sitting, Cuff Size: Large)   Pulse (!) 111   Temp 98.5 F (36.9 C) (Oral)   Wt 159 lb 9.6 oz (72.4 kg)   SpO2 100%   BMI 27.40 kg/m     Physical Exam Constitutional:      Appearance: Normal appearance.  Cardiovascular:     Rate and Rhythm: Normal rate and regular rhythm.     Heart sounds: Normal heart sounds.  Pulmonary:     Effort: Pulmonary effort is normal.     Breath sounds: Normal breath sounds.  Skin:    General: Skin is warm and dry.  Neurological:     General: No focal deficit present.     Mental Status: She is alert and oriented to person, place, and time. Mental status is at baseline.  Psychiatric:        Mood and Affect: Mood normal.        Behavior: Behavior normal.       Results for orders placed or performed in visit on 10/03/20  POCT urine pregnancy  Result Value Ref Range   Preg Test, Ur Negative Negative    Assessment & Plan    1. Depression complicating pregnancy, antepartum  Will add Buspar. Follow up 3 months for anxiety and depression as well as depo shot.   - busPIRone (BUSPAR) 7.5 MG tablet; Take 1 tablet (7.5 mg total) by mouth 2 (two) times daily.  Dispense: 180 tablet; Refill: 0 - DULoxetine (CYMBALTA) 20 MG capsule; Take 2 capsules (40 mg total) by mouth daily.  Dispense: 180 capsule; Refill: 0  2. Encounter for contraceptive management, unspecified type  - POCT urine pregnancy - medroxyPROGESTERone (DEPO-PROVERA) injection 150 mg  3. Other migraine without status migrainosus, not intractable   4. Skin lesion  - Ambulatory referral to Dermatology   Return in about 3 months (around 12/31/2020) for HTN .      ITrinna Post, PA-C, have reviewed all documentation for this visit. The documentation on 10/03/20 for the exam, diagnosis, procedures, and orders are all accurate and complete.  The entirety of the information documented in the History of Present Illness, Review of Systems and Physical Exam were personally obtained by  me. Portions of this information were initially documented by [facility] and reviewed by me for thoroughness and accuracy.     Paulene Floor  [facility] [phone] (phone) [phone] (fax)  [place]

## 2020-10-11 ENCOUNTER — Other Ambulatory Visit: Payer: Self-pay

## 2020-10-11 ENCOUNTER — Encounter: Payer: Self-pay | Admitting: Physician Assistant

## 2020-10-11 ENCOUNTER — Ambulatory Visit (INDEPENDENT_AMBULATORY_CARE_PROVIDER_SITE_OTHER): Payer: Medicaid Other | Admitting: Physician Assistant

## 2020-10-11 VITALS — BP 128/85 | HR 116 | Temp 98.3°F | Resp 16 | Wt 162.8 lb

## 2020-10-11 DIAGNOSIS — S01332A Puncture wound without foreign body of left ear, initial encounter: Secondary | ICD-10-CM | POA: Diagnosis not present

## 2020-10-11 MED ORDER — MUPIROCIN 2 % EX OINT: 1.0000 "application " | TOPICAL_OINTMENT | Freq: Two times a day (BID) | CUTANEOUS | 0 refills | Status: DC

## 2020-10-11 NOTE — Patient Instructions (Signed)
Instructions Following Ear Piercing  Your ears have just been pierced under sterile conditions with studs made of 24 kt. gold over high-grade surgical stainless steel. In order to promote continued infection-free results, please follow these simple suggestions: 1. For the first month, turn the studs several times a day (like winding a watch), and clean both front and back of the earlobe with a cotton ball dipped in alcohol two times a day. 2. Leave the studs in ears for six (6) weeks. For the next few months after this, small 14 kt. gold post earrings may be worn. These should be 14 kt. gold posts - fronts and backs. They may be taken out daily and cleaned with alcohol. Do not leave them out more than a few minutes. 3. Be sure to contact the office if at any time your earlobes are red or tender, or if there is any oozing, rash, or pus. 4. Only small hoop earrings or post-type earrings should be worn for the first six months. 5. Wear quality earrings. Inexpensive earrings cause problems.

## 2020-10-11 NOTE — Progress Notes (Signed)
Established patient visit   Patient: Dominique Navarro   DOB: [birth year]   30 y.o. Female  MRN: [id number] Visit Date: 10/11/2020  Today's healthcare provider: Trinna Post, PA-C   Chief Complaint  Patient presents with  . Ear Pain   Subjective    HPI  Ear problem: Patient is here for an evaluation of possible ear piercing infection of the left ear. She got a piercing at [facility] about a month ago. She complains of redness, swelling and pain around the piercing site. Piercing was done 09/07/2020, but symptoms started 2-3 days ago. She also reports some bloody drainage from the piercing. She reports that she has been cleaning the piercing with the solution that was given to her.      Medications: Outpatient Medications Prior to Visit  Medication Sig  . albuterol (VENTOLIN HFA) 108 (90 Base) MCG/ACT inhaler Inhale 2 puffs into the lungs every 4 (four) hours as needed for wheezing or shortness of breath.  Marland Kitchen azelastine (ASTELIN) 0.1 % nasal spray SMARTSIG:1 Spray(s) Both Nares Twice Daily PRN  . busPIRone (BUSPAR) 7.5 MG tablet Take 1 tablet (7.5 mg total) by mouth 2 (two) times daily.  . DULoxetine (CYMBALTA) 20 MG capsule Take 2 capsules (40 mg total) by mouth daily.  Marland Kitchen EPINEPHrine 0.3 mg/0.3 mL IJ SOAJ injection Inject 0.3 mLs (0.3 mg total) into the muscle as needed for anaphylaxis.  . fluticasone (FLONASE) 50 MCG/ACT nasal spray Place into both nostrils.  Marland Kitchen ibuprofen (ADVIL) 600 MG tablet Take 1 tablet (600 mg total) by mouth every 6 (six) hours as needed.  . montelukast (SINGULAIR) 10 MG tablet TAKE 1 TABLET BY MOUTH EVERYDAY AT BEDTIME  . Multiple Vitamin (MULTIVITAMIN WITH MINERALS) TABS tablet Take 1 tablet by mouth daily.  . nortriptyline (PAMELOR) 50 MG capsule Take 50 mg by mouth at bedtime.  . promethazine (PHENERGAN) 12.5 MG tablet Take 1 tablet (12.5 mg total) by mouth every 8 (eight) hours as needed for nausea or vomiting.  . rizatriptan (MAXALT) 10 MG tablet Take 10  mg by mouth 2 (two) times daily as needed.  Marland Kitchen tiZANidine (ZANAFLEX) 2 MG tablet TAKE 1 CAPSULE BY MOUTH AT BEDTIME.   No facility-administered medications prior to visit.    Review of Systems  Constitutional: Negative for appetite change, chills, fatigue and fever.  HENT: Positive for ear pain.   Respiratory: Negative for chest tightness and shortness of breath.   Cardiovascular: Negative for chest pain and palpitations.  Gastrointestinal: Negative for abdominal pain, nausea and vomiting.  Neurological: Negative for dizziness and weakness.       Objective    BP 128/85 (BP Location: Left Arm)   Pulse (!) 116   Temp 98.3 F (36.8 C) (Oral)   Resp 16   Wt 162 lb 12.8 oz (73.8 kg)   BMI 27.94 kg/m     Physical Exam Constitutional:      Appearance: Normal appearance.  HENT:     Ears:   Skin:    General: Skin is warm and dry.  Neurological:     Mental Status: She is alert and oriented to person, place, and time. Mental status is at baseline.  Psychiatric:        Mood and Affect: Mood normal.        Behavior: Behavior normal.       No results found for any visits on 10/11/20.  Assessment & Plan    1. Complication of left ear piercing, initial  encounter  Return precautions advised.   - mupirocin ointment (BACTROBAN) 2 %; Apply 1 application topically 2 (two) times daily.  Dispense: 22 g; Refill: 0   Return if symptoms worsen or fail to improve.      ITrinna Post, PA-C, have reviewed all documentation for this visit. The documentation on 10/18/20 for the exam, diagnosis, procedures, and orders are all accurate and complete.  The entirety of the information documented in the History of Present Illness, Review of Systems and Physical Exam were personally obtained by me. Portions of this information were initially documented by [facility] and reviewed by me for thoroughness and accuracy.     Paulene Floor  [facility] [phone] (phone) [phone] (fax)  [place]

## 2020-10-30 ENCOUNTER — Ambulatory Visit
Admission: EM | Admit: 2020-10-30 | Discharge: 2020-10-30 | Disposition: A | Discharge status: Home / Self Care | Payer: Medicaid Other

## 2020-10-30 ENCOUNTER — Other Ambulatory Visit: Payer: Self-pay

## 2020-10-30 ENCOUNTER — Ambulatory Visit: Payer: Self-pay | Admitting: *Deleted

## 2020-10-30 DIAGNOSIS — J069 Acute upper respiratory infection, unspecified: Secondary | ICD-10-CM | POA: Diagnosis not present

## 2020-10-30 HISTORY — DX: Other allergy status, other than to drugs and biological substances: Z91.09

## 2020-10-30 MED ORDER — BENZONATATE 100 MG PO CAPS: 200.0000 mg | ORAL_CAPSULE | Freq: Three times a day (TID) | ORAL | 0 refills | Status: DC

## 2020-10-30 MED ORDER — PROMETHAZINE-DM 6.25-15 MG/5ML PO SYRP: 5.0000 mL | ORAL_SOLUTION | Freq: Four times a day (QID) | ORAL | 0 refills | Status: DC | PRN

## 2020-10-30 MED ORDER — IPRATROPIUM BROMIDE 0.06 % NA SOLN: 2.0000 | Freq: Four times a day (QID) | NASAL | 12 refills | Status: DC

## 2020-10-30 NOTE — ED Triage Notes (Addendum)
Pt c/o barky cough, nasal congestion, sore throat, chest tightness since this past Sunday. Pt reports her children have also been sick. Pt denies f/n/v/d or other symptoms.

## 2020-10-30 NOTE — Telephone Encounter (Signed)
Pt called with complaints of shortness of breath that started 10/28/20; it has worsened today; she has shortness of breath at rest; she has a productive cough with yellow secretions; she is also wheezing; the pt says she has a history of bronchitis; recommendations made per nurse triage protocol; the pt about waiting until 10/31/20 for visit; explained that based on her symptoms she should seek prompt attention she verbalized understanding but will go to urgent care; the pt is seen by Carles Collet, [facility]; will route to office for notification.     Reason for Disposition . [1] MODERATE difficulty breathing (e.g., speaks in phrases, SOB even at rest, pulse 100-120) AND [2] NEW-onset or WORSE than normal  Answer Assessment - Initial Assessment Questions 1. RESPIRATORY STATUS: "Describe your breathing?" (e.g., wheezing, shortness of breath, unable to speak, severe coughing)      Shortness of breath 2. ONSET: "When did this breathing problem begin?"    10/28/20 3. PATTERN "Does the difficult breathing come and go, or has it been constant since it started?"     constabt 4. SEVERITY: "How bad is your breathing?" (e.g., mild, moderate, severe)    - MILD: No SOB at rest, mild SOB with walking, speaks normally in sentences, can lay down, no retractions, pulse < 100.    - MODERATE: SOB at rest, SOB with minimal exertion and prefers to sit, cannot lie down flat, speaks in phrases, mild retractions, audible wheezing, pulse 100-120.    - SEVERE: Very SOB at rest, speaks in single words, struggling to breathe, sitting hunched forward, retractions, pulse > 120     moderate 5. RECURRENT SYMPTOM: "Have you had difficulty breathing before?" If Yes, ask: "When was the last time?" and "What happened that time?"      yes hx bronchitis, last epidsooe 05/2021 6. CARDIAC HISTORY: "Do you have any history of heart disease?" (e.g., heart attack, angina, bypass surgery, angioplasty)     Yes metoprolol for  HR 7. LUNG HISTORY: "Do you have any history of lung disease?"  (e.g., pulmonary embolus, asthma, emphysema)    Hx bronchitis 8. CAUSE: "What do you think is causing the breathing problem?"  ? bronchitis 9. OTHER SYMPTOMS: "Do you have any other symptoms? (e.g., dizziness, runny nose, cough, chest pain, fever)     cough 10. PREGNANCY: "Is there any chance you are pregnant?" "When was your last menstrual period?"     No, Depo injection 11. TRAVEL: "Have you traveled out of the country in the last month?" (e.g., travel history, exposures)       no  Protocols used: BREATHING DIFFICULTY-A-AH

## 2020-10-30 NOTE — Discharge Instructions (Addendum)
Use the Atrovent nasal spray, 2 squirts in each nostril 4 times a day as needed for nasal congestion and postnasal drip.  Use the Tessalon Perles every 8 hours during the day as needed for cough.  Take them with a small sip of water.  They may give you some numbness to the base of your tongue or metallic taste in her mouth, this is normal.  Use the Promethazine DM cough syrup at bedtime for cough and congestion as this will make you drowsy.  Increase your oral fluid intake so that you keep your mucus nice and thin anesthesia for your body to clear.  Perform sinus irrigation 2-3 times a day with a NeilMed sinus rinse kit and distilled water.  Do not use tap water.  Discussed switching your prescription antihistamine with your PCP to something such as Clarinex.  You may also want to discuss possibly using compounded ketotifen.  If you develop any new or worsening symptoms return for reevaluation or see your primary care provider.

## 2020-10-30 NOTE — ED Provider Notes (Signed)
[facility]    CSN: [id number] Arrival date & time: 10/30/20  1531      History   Chief Complaint Chief Complaint  Patient presents with  . Cough  . Nasal Congestion    HPI Dominique Navarro is a 30 y.o. female.   HPI   30 year old female here for evaluation of cough, nasal congestion, sore throat, and chest tightness.  Patient reports that her symptoms started 2 days ago.  She has had intermittent nasal discharge for yellow mucus and an intermittent productive cough for yellow sputum.  She has had some intermittent shortness of breath with exertion.  Patient denies fever, wheezing, ear pain or pressure, GI complaints, or known Covid exposure.  Patient was diagnosed with COVID 1 month ago.  Patient reports that she had a cough with COVID but her symptoms completely resolved.  Past Medical History:  Diagnosis Date  . Depression   . Environmental allergies   . Migraine     Patient Active Problem List   Diagnosis Date Noted  . 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 Arthralgia 06/06/2019  . Osteoporosis screening 06/06/2019  . Other insomnia 06/06/2019  . Migraine headache 05/14/2018  . Advance directive discussed with patient 09/02/2013  . Depression complicating pregnancy, antepartum 04/29/2013  . FHx: epilepsy 04/29/2013  . Medication exposure during first trimester of pregnancy 04/29/2013    Past Surgical History:  Procedure Laterality Date  . CYST REMOVAL NECK      OB History    Gravida  3   Para  2   Term  2   Preterm      AB  1   Living  2     SAB  1   IAB      Ectopic      Multiple      Live Births  2            Home Medications    Prior to Admission medications   Medication Sig Start Date End Date Taking? Authorizing Provider  albuterol (VENTOLIN HFA) 108 (90 Base) MCG/ACT inhaler Inhale 2 puffs into the lungs every 4 (four) hours as needed for wheezing or shortness of breath. 03/23/20  Yes  Sharion Balloon, NP  azelastine (ASTELIN) 0.1 % nasal spray SMARTSIG:1 Spray(s) Both Nares Twice Daily PRN 04/24/20  Yes [provider]  benzonatate (TESSALON) 100 MG capsule Take 2 capsules (200 mg total) by mouth every 8 (eight) hours. 10/30/20  Yes Margarette Canada, NP  busPIRone (BUSPAR) 7.5 MG tablet Take 1 tablet (7.5 mg total) by mouth 2 (two) times daily. 10/03/20 01/01/21 Yes Trinna Post, PA-C  DULoxetine (CYMBALTA) 20 MG capsule Take 2 capsules (40 mg total) by mouth daily. 10/03/20 01/01/21 Yes Trinna Post, PA-C  EPINEPHrine 0.3 mg/0.3 mL IJ SOAJ injection Inject 0.3 mLs (0.3 mg total) into the muscle as needed for anaphylaxis. 12/02/19  Yes Carles Collet M, PA-C  fluticasone (FLONASE) 50 MCG/ACT nasal spray Place into both nostrils. 05/28/20  Yes [provider]  ibuprofen (ADVIL) 600 MG tablet Take 1 tablet (600 mg total) by mouth every 6 (six) hours as needed. 06/20/20  Yes Melynda Ripple, MD  ipratropium (ATROVENT) 0.06 % nasal spray Place 2 sprays into both nostrils 4 (four) times daily. 10/30/20  Yes Margarette Canada, NP  metoprolol succinate (TOPROL-XL) 25 MG 24 hr tablet Take 25 mg by mouth daily. 10/29/20  Yes [provider]  montelukast (SINGULAIR) 10 MG tablet  TAKE 1 TABLET BY MOUTH EVERYDAY AT BEDTIME 11/14/19  Yes Pollak, Adriana M, PA-C  Multiple Vitamin (MULTIVITAMIN WITH MINERALS) TABS tablet Take 1 tablet by mouth daily.   Yes [provider]  nortriptyline (PAMELOR) 50 MG capsule Take 50 mg by mouth at bedtime. 03/11/20  Yes [provider]  promethazine (PHENERGAN) 12.5 MG tablet Take 1 tablet (12.5 mg total) by mouth every 8 (eight) hours as needed for nausea or vomiting. 08/31/19  Yes Trinna Post, PA-C  promethazine-dextromethorphan (PROMETHAZINE-DM) 6.25-15 MG/5ML syrup Take 5 mLs by mouth 4 (four) times daily as needed. 10/30/20  Yes Margarette Canada, NP  rizatriptan (MAXALT) 10 MG tablet Take 10 mg by mouth 2 (two) times daily as  needed. 05/13/20  Yes [provider]  tiZANidine (ZANAFLEX) 2 MG tablet TAKE 1 CAPSULE BY MOUTH AT BEDTIME. 08/30/20  Yes Pollak, Adriana M, PA-C  mupirocin ointment (BACTROBAN) 2 % Apply 1 application topically 2 (two) times daily. 10/11/20   Trinna Post, PA-C  amitriptyline (ELAVIL) 25 MG tablet Take 1 tablet (25 mg total) by mouth at bedtime. 08/31/19 01/07/20  Trinna Post, PA-C    Family History Family History  Problem Relation Age of Onset  . Asthma Mother   . COPD Mother   . COPD Father   . Seizures Son     Social History Social History   Tobacco Use  . Smoking status: Former Smoker    Types: Cigarettes    Quit date: 02/23/2019    Years since quitting: 1.6  . Smokeless tobacco: Never Used  Vaping Use  . Vaping Use: Never used  Substance Use Topics  . Alcohol use: Never  . Drug use: Never     Allergies   Elemental sulfur, Bee venom, Sulfa antibiotics, and Topiramate   Review of Systems Review of Systems  Constitutional: Negative for activity change, appetite change and fever.  HENT: Positive for congestion, postnasal drip, rhinorrhea and sore throat. Negative for ear pain.   Respiratory: Positive for cough and shortness of breath. Negative for wheezing.   Gastrointestinal: Negative for diarrhea, nausea and vomiting.  Musculoskeletal: Negative for arthralgias and myalgias.  Skin: Negative for rash.  Hematological: Negative.   Psychiatric/Behavioral: Negative.      Physical Exam Triage Vital Signs ED Triage Vitals  Enc Vitals Group     BP      Pulse      Resp      Temp      Temp src      SpO2      Weight      Height      Head Circumference      Peak Flow      Pain Score      Pain Loc      Pain Edu?      Excl. in [place]?    No data found.  Updated Vital Signs BP 120/86 (BP Location: Left Arm)   Pulse 93   Temp 98.5 F (36.9 C) (Oral)   Resp 18   Ht _0  (1.626 m)   Wt 162 lb (73.5 kg)   SpO2 100%   BMI 27.81 kg/m   Visual  Acuity Right Eye Distance:   Left Eye Distance:   Bilateral Distance:    Right Eye Near:   Left Eye Near:    Bilateral Near:     Physical Exam Vitals and nursing note reviewed.  Constitutional:      General: She is not  in acute distress.    Appearance: Normal appearance. She is not ill-appearing.  HENT:     Head: Normocephalic and atraumatic.     Right Ear: Tympanic membrane, ear canal and external ear normal. There is no impacted cerumen.     Left Ear: Tympanic membrane, ear canal and external ear normal. There is no impacted cerumen.     Nose: Congestion and rhinorrhea present.     Mouth/Throat:     Mouth: Mucous membranes are moist.     Pharynx: Oropharynx is clear. Posterior oropharyngeal erythema present.  Cardiovascular:     Rate and Rhythm: Normal rate and regular rhythm.     Pulses: Normal pulses.     Heart sounds: Normal heart sounds.  Pulmonary:     Effort: Pulmonary effort is normal.     Breath sounds: Normal breath sounds.  Musculoskeletal:     Cervical back: Normal range of motion and neck supple.  Lymphadenopathy:     Cervical: No cervical adenopathy.  Skin:    General: Skin is warm and dry.     Capillary Refill: Capillary refill takes less than 2 seconds.     Findings: No erythema or rash.  Neurological:     General: No focal deficit present.     Mental Status: She is alert and oriented to person, place, and time.  Psychiatric:        Mood and Affect: Mood normal.        Behavior: Behavior normal.        Thought Content: Thought content normal.        Judgment: Judgment normal.      UC Treatments / Results  Labs (all labs ordered are listed, but only abnormal results are displayed) Labs Reviewed - No data to display  EKG   Radiology No results found.  Procedures Procedures (including critical care time)  Medications Ordered in UC Medications - No data to display  Initial Impression / Assessment and Plan / UC Course  I have reviewed the  triage vital signs and the nursing notes.  Pertinent labs & imaging results that were available during my care of the patient were reviewed by me and considered in my medical decision making (see chart for details).   Patient is a very pleasant 30 year old female patient here for evaluation of respiratory symptoms that have been going on for the past 2 days.  Patient not had any fever but she has had a productive cough and yellow nasal discharge.  Physical exam reveals bilateral clear external auditory canals with pearly gray tympanic membranes and a normal light reflex.  Nasal mucosa is erythematous and edematous with clear nasal discharge.  Maxillary frontal sinuses are nontender to percussion.  Tonsillar pillars are unremarkable but posterior oropharynx has erythema and clear postnasal drip.  No cervical lymphadenopathy appreciated.  Lungs are clear auscultation all fields.  Patient exam is consistent with a URI with cough.  Will treat with Atrovent nasal spray, Tessalon Perles, Promethazine DM, and patient has an inhaler at home.   Final Clinical Impressions(s) / UC Diagnoses   Final diagnoses:  Viral URI with cough     Discharge Instructions     Use the Atrovent nasal spray, 2 squirts in each nostril 4 times a day as needed for nasal congestion and postnasal drip.  Use the Tessalon Perles every 8 hours during the day as needed for cough.  Take them with a small sip of water.  They may give you some numbness  to the base of your tongue or metallic taste in her mouth, this is normal.  Use the Promethazine DM cough syrup at bedtime for cough and congestion as this will make you drowsy.  Increase your oral fluid intake so that you keep your mucus nice and thin anesthesia for your body to clear.  Perform sinus irrigation 2-3 times a day with a [employer] sinus rinse kit and distilled water.  Do not use tap water.  Discussed switching your prescription antihistamine with your PCP to something  such as Clarinex.  You may also want to discuss possibly using compounded ketotifen.  If you develop any new or worsening symptoms return for reevaluation or see your primary care provider.    ED Prescriptions    Medication Sig Dispense Auth. Provider   ipratropium (ATROVENT) 0.06 % nasal spray Place 2 sprays into both nostrils 4 (four) times daily. 15 mL Margarette Canada, NP   benzonatate (TESSALON) 100 MG capsule Take 2 capsules (200 mg total) by mouth every 8 (eight) hours. 21 capsule Margarette Canada, NP   promethazine-dextromethorphan (PROMETHAZINE-DM) 6.25-15 MG/5ML syrup Take 5 mLs by mouth 4 (four) times daily as needed. 118 mL Margarette Canada, NP     PDMP not reviewed this encounter.   Margarette Canada, NP 10/30/20 1623

## 2020-11-13 ENCOUNTER — Encounter: Payer: Self-pay | Admitting: Physician Assistant

## 2020-11-15 ENCOUNTER — Emergency Department: Payer: Medicaid Other

## 2020-11-15 ENCOUNTER — Emergency Department
Admission: EM | Admit: 2020-11-15 | Discharge: 2020-11-15 | Disposition: A | Discharge status: Home / Self Care | Payer: Medicaid Other | Attending: Emergency Medicine | Admitting: Emergency Medicine

## 2020-11-15 ENCOUNTER — Ambulatory Visit: Payer: Self-pay | Admitting: *Deleted

## 2020-11-15 ENCOUNTER — Other Ambulatory Visit: Payer: Self-pay

## 2020-11-15 DIAGNOSIS — R0789 Other chest pain: Secondary | ICD-10-CM | POA: Diagnosis present

## 2020-11-15 DIAGNOSIS — Z87891 Personal history of nicotine dependence: Secondary | ICD-10-CM | POA: Insufficient documentation

## 2020-11-15 DIAGNOSIS — R002 Palpitations: Secondary | ICD-10-CM | POA: Diagnosis not present

## 2020-11-15 DIAGNOSIS — R079 Chest pain, unspecified: Secondary | ICD-10-CM

## 2020-11-15 LAB — CBC
HCT: 39.8 % (ref 36.0–46.0)
Hemoglobin: 13.6 g/dL (ref 12.0–15.0)
MCH: 28.6 pg (ref 26.0–34.0)
MCHC: 34.2 g/dL (ref 30.0–36.0)
MCV: 83.6 fL (ref 80.0–100.0)
Platelets: 237 10*3/uL (ref 150–400)
RBC: 4.76 MIL/uL (ref 3.87–5.11)
RDW: 12.2 % (ref 11.5–15.5)
WBC: 6.2 10*3/uL (ref 4.0–10.5)
nRBC: 0 % (ref 0.0–0.2)

## 2020-11-15 LAB — BASIC METABOLIC PANEL
Anion gap: 5 (ref 5–15)
BUN: 12 mg/dL (ref 6–20)
CO2: 26 mmol/L (ref 22–32)
Calcium: 9 mg/dL (ref 8.9–10.3)
Chloride: 109 mmol/L (ref 98–111)
Creatinine, Ser: 0.74 mg/dL (ref 0.44–1.00)
GFR, Estimated: >60 mL/min (ref >60)
Glucose, Bld: 110 mg/dL — ABNORMAL HIGH (ref 70–99)
Potassium: 3.9 mmol/L (ref 3.5–5.1)
Sodium: 140 mmol/L (ref 135–145)

## 2020-11-15 LAB — TROPONIN I (HIGH SENSITIVITY): Troponin I (High Sensitivity): <2 ng/L (ref <18)

## 2020-11-15 NOTE — Discharge Instructions (Addendum)
Please call the number provided for cardiology today to arrange a follow-up appointment as soon as possible for consideration of Holter monitor testing.  Return to the emergency department for any return of/worsening chest pain especially if associated with shortness of breath nausea or sweating.  As we discussed please begin taking your metoprolol in the morning opposed to evening.

## 2020-11-15 NOTE — ED Provider Notes (Signed)
[facility] Emergency Department Provider Note  Time seen: 2:37 PM  I have reviewed the triage vital signs and the nursing notes.   HISTORY  Chief Complaint Chest Pain   HPI Dominique Navarro is a 30 y.o. female with a past medical history of depression, migraines, palpitations on metoprolol, presents to the emergency department for palpitations and chest pain.  According to the patient she has a history of palpitations as prescribed metoprolol by her doctor.  States she takes that night.  She states last night however she was experiencing palpitations which she describes as heart racing along with chest tightness with some radiation to the right arm.  Patient states the discomfort went away last night but returned again today while at work, patient became concerned so she came to the emergency department for evaluation.  Patient denies any current chest discomfort but states it comes and goes.  Denies any recent illnesses fever congestion.  No leg swelling.  No pleuritic pain.   Past Medical History:  Diagnosis Date  . Depression   . Environmental allergies   . Migraine     Patient Active Problem List   Diagnosis Date Noted  . 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 Arthralgia 06/06/2019  . Osteoporosis screening 06/06/2019  . Other insomnia 06/06/2019  . Migraine headache 05/14/2018  . Advance directive discussed with patient 09/02/2013  . Depression complicating pregnancy, antepartum 04/29/2013  . FHx: epilepsy 04/29/2013  . Medication exposure during first trimester of pregnancy 04/29/2013    Past Surgical History:  Procedure Laterality Date  . CYST REMOVAL NECK      Prior to Admission medications   Medication Sig Start Date End Date Taking? Authorizing Provider  albuterol (VENTOLIN HFA) 108 (90 Base) MCG/ACT inhaler Inhale 2 puffs into the lungs every 4 (four) hours as needed for wheezing or shortness of breath. 03/23/20    Sharion Balloon, NP  azelastine (ASTELIN) 0.1 % nasal spray SMARTSIG:1 Spray(s) Both Nares Twice Daily PRN 04/24/20   [provider]  benzonatate (TESSALON) 100 MG capsule Take 2 capsules (200 mg total) by mouth every 8 (eight) hours. 10/30/20   Margarette Canada, NP  busPIRone (BUSPAR) 7.5 MG tablet Take 1 tablet (7.5 mg total) by mouth 2 (two) times daily. 10/03/20 01/01/21  Trinna Post, PA-C  DULoxetine (CYMBALTA) 20 MG capsule Take 2 capsules (40 mg total) by mouth daily. 10/03/20 01/01/21  Trinna Post, PA-C  EPINEPHrine 0.3 mg/0.3 mL IJ SOAJ injection Inject 0.3 mLs (0.3 mg total) into the muscle as needed for anaphylaxis. 12/02/19   Trinna Post, PA-C  fluticasone (FLONASE) 50 MCG/ACT nasal spray Place into both nostrils. 05/28/20   [provider]  ibuprofen (ADVIL) 600 MG tablet Take 1 tablet (600 mg total) by mouth every 6 (six) hours as needed. 06/20/20   Melynda Ripple, MD  ipratropium (ATROVENT) 0.06 % nasal spray Place 2 sprays into both nostrils 4 (four) times daily. 10/30/20   Margarette Canada, NP  metoprolol succinate (TOPROL-XL) 25 MG 24 hr tablet Take 25 mg by mouth daily. 10/29/20   [provider]  montelukast (SINGULAIR) 10 MG tablet TAKE 1 TABLET BY MOUTH EVERYDAY AT BEDTIME 11/14/19   Trinna Post, PA-C  Multiple Vitamin (MULTIVITAMIN WITH MINERALS) TABS tablet Take 1 tablet by mouth daily.    [provider]  mupirocin ointment (BACTROBAN) 2 % Apply 1 application topically 2 (two) times daily. 10/11/20   Trinna Post, PA-C  nortriptyline (PAMELOR) 50 MG capsule Take 50 mg by mouth at bedtime. 03/11/20   [provider]  promethazine (PHENERGAN) 12.5 MG tablet Take 1 tablet (12.5 mg total) by mouth every 8 (eight) hours as needed for nausea or vomiting. 08/31/19   Trinna Post, PA-C  promethazine-dextromethorphan (PROMETHAZINE-DM) 6.25-15 MG/5ML syrup Take 5 mLs by mouth 4 (four) times daily as needed. 10/30/20   Margarette Canada, NP   rizatriptan (MAXALT) 10 MG tablet Take 10 mg by mouth 2 (two) times daily as needed. 05/13/20   [provider]  tiZANidine (ZANAFLEX) 2 MG tablet TAKE 1 CAPSULE BY MOUTH AT BEDTIME. 08/30/20   Carles Collet M, PA-C  amitriptyline (ELAVIL) 25 MG tablet Take 1 tablet (25 mg total) by mouth at bedtime. 08/31/19 01/07/20  Trinna Post, PA-C    Allergies  Allergen Reactions  . Elemental Sulfur Hives and Other (See Comments)    Other reaction(s): Other (See Comments) Other reaction(s): Other (See Comments)  . Bee Venom Hives  . Sulfa Antibiotics Hives  . Topiramate Palpitations    Family History  Problem Relation Age of Onset  . Asthma Mother   . COPD Mother   . COPD Father   . Seizures Son     Social History Social History   Tobacco Use  . Smoking status: Former Smoker    Types: Cigarettes    Quit date: 02/23/2019    Years since quitting: 1.7  . Smokeless tobacco: Never Used  Vaping Use  . Vaping Use: Never used  Substance Use Topics  . Alcohol use: Never  . Drug use: Never    Review of Systems Constitutional: Negative for fever. Cardiovascular: Chest pain/tightness last night and again today radiating into the right arm. Respiratory: Negative for shortness of breath. Gastrointestinal: Negative for abdominal pain.  States nausea vomiting 1 week ago after donating plasma but none since. Genitourinary: Negative for urinary compaints Musculoskeletal: Negative for musculoskeletal complaints Neurological: Negative for headache All other ROS negative  ____________________________________________   PHYSICAL EXAM:  VITAL SIGNS: ED Triage Vitals  Enc Vitals Group     BP 11/15/20 1209 108/88     Pulse Rate 11/15/20 1209 98     Resp 11/15/20 1209 20     Temp 11/15/20 1209 98.6 F (37 C)     Temp Source 11/15/20 1209 Oral     SpO2 11/15/20 1209 100 %     Weight 11/15/20 1211 162 lb (73.5 kg)     Height 11/15/20 1211 5\' 4"  (1.626 m)     Head Circumference --       Peak Flow --      Pain Score 11/15/20 1210 7     Pain Loc --      Pain Edu? --      Excl. in [place]? --    Constitutional: Alert and oriented. Well appearing and in no distress. Eyes: Normal exam ENT      Head: Normocephalic and atraumatic.      Mouth/Throat: Mucous membranes are moist. Cardiovascular: Normal rate, regular rhythm. Respiratory: Normal respiratory effort without tachypnea nor retractions. Breath sounds are clear  Gastrointestinal: Soft and nontender. No distention.   Musculoskeletal: Nontender with normal range of motion in all extremities. No lower extremity tenderness or edema. Neurologic:  Normal speech and language. No gross focal neurologic deficits  Skin:  Skin is warm, dry and intact.  Psychiatric: Mood and affect are normal.   ____________________________________________    EKG  EKG viewed and  interpreted by myself shows sinus tachycardia 102 bpm with a narrow QRS, normal axis, normal intervals, nonspecific ST changes.  ____________________________________________    RADIOLOGY  Chest x-ray is negative  ____________________________________________   INITIAL IMPRESSION / ASSESSMENT AND PLAN / ED COURSE  Pertinent labs & imaging results that were available during my care of the patient were reviewed by me and considered in my medical decision making (see chart for details).   Patient presents emergency department for palpitations which she describes as a heart racing sensation last night it began today while at work along with chest tightness.  Overall the patient appears well denies any tightness or palpitations currently.  Patient's EKG shows a rate of 100 bpm.  Currently on my examination patient has a heart rate around 80 bpm and regular.  No concerning findings on lab work, troponin is negative.  Patient states she is prescribed metoprolol tartrate and takes it at night.  I discussed with the patient switching this to the morning instead of  nighttime.  Also discussed follow-up with cardiology for further evaluation and Holter monitor testing.  Patient agreeable plan of care.  Provided my typical chest pain return precautions.  Embry Manrique was evaluated in Emergency Department on 11/15/2020 for the symptoms described in the history of present illness. She was evaluated in the context of the global COVID-19 pandemic, which necessitated consideration that the patient might be at risk for infection with the SARS-CoV-2 virus that causes COVID-19. Institutional protocols and algorithms that pertain to the evaluation of patients at risk for COVID-19 are in a state of rapid change based on information released by regulatory bodies including the CDC and federal and state organizations. These policies and algorithms were followed during the patient's care in the ED.  ____________________________________________   FINAL CLINICAL IMPRESSION(S) / ED DIAGNOSES  Palpitations Chest pain   Harvest Dark, MD 11/15/20 1440

## 2020-11-15 NOTE — Telephone Encounter (Signed)
Noted  

## 2020-11-15 NOTE — Telephone Encounter (Signed)
  C/o chest tightness and heart racing, and vomited x 1 an hour ago. C/o last night chest discomfort started and she noted ringing in her ears and felt like heart exploded and took an hour for her to feel better. Reported right arm felt numb and tingling and chest tightness last night. Today chest tightness constant and heart racing reported with vomiting x 1. Instructed patient to call 911 or go to ED now . Care advise given. Patient verbalized understanding of care advise and is going to seek help now.  Reason for Disposition . Heart beating < 50 beats per minute OR > 140 beats per minute  Answer Assessment - Initial Assessment Questions 1. LOCATION: "Where does it hurt?"       Chest  2. RADIATION: "Does the pain go anywhere else?" (e.g., into neck, jaw, arms, back)     Right arm numb and tingling last night  3. ONSET: "When did the chest pain begin?" (Minutes, hours or days)      Last night  4. PATTERN "Does the pain come and go, or has it been constant since it started?"  "Does it get worse with exertion?"      Constant now  5. DURATION: "How long does it last" (e.g., seconds, minutes, hours)     na 6. SEVERITY: "How bad is the pain?"  (e.g., Scale 1-10; mild, moderate, or severe)    - MILD (1-3): doesn't interfere with normal activities     - MODERATE (4-7): interferes with normal activities or awakens from sleep    - SEVERE (8-10): excruciating pain, unable to do any normal activities       Hurting and tightness in chest now  7. CARDIAC RISK FACTORS: "Do you have any history of heart problems or risk factors for heart disease?" (e.g., angina, prior heart attack; diabetes, high blood pressure, high cholesterol, smoker, or strong family history of heart disease)     na 8. PULMONARY RISK FACTORS: "Do you have any history of lung disease?"  (e.g., blood clots in lung, asthma, emphysema, birth control pills)     na 9. CAUSE: "What do you think is causing the chest pain?"     na 10. OTHER  SYMPTOMS: "Do you have any other symptoms?" (e.g., dizziness, nausea, vomiting, sweating, fever, difficulty breathing, cough)       Vomiting, ringing in ears , heart racing 11. PREGNANCY: "Is there any chance you are pregnant?" "When was your last menstrual period?"       na  Protocols used: CHEST PAIN-A-AH

## 2020-11-15 NOTE — ED Triage Notes (Signed)
Pt states she woke up at 930 pm last evening with her heart racing, right arm numbness, N/V. Pt states she takes metoprolol and has not missed any doses. Pt states she feels as though her chest is tight and she is SOB at this time. Pt states she feels anxious as well.

## 2020-12-15 ENCOUNTER — Ambulatory Visit
Admission: EM | Admit: 2020-12-15 | Discharge: 2020-12-15 | Disposition: A | Discharge status: Home / Self Care | Payer: Medicaid Other | Attending: Emergency Medicine | Admitting: Emergency Medicine

## 2020-12-15 DIAGNOSIS — M26621 Arthralgia of right temporomandibular joint: Secondary | ICD-10-CM

## 2020-12-15 MED ORDER — PREDNISONE 20 MG PO TABS: 40.0000 mg | ORAL_TABLET | Freq: Every day | ORAL | 0 refills | Status: AC

## 2020-12-15 MED ORDER — IBUPROFEN 600 MG PO TABS: 600.0000 mg | ORAL_TABLET | Freq: Four times a day (QID) | ORAL | 0 refills | Status: DC | PRN

## 2020-12-15 MED ORDER — CYCLOBENZAPRINE HCL 10 MG PO TABS: 10.0000 mg | ORAL_TABLET | Freq: Every day | ORAL | 0 refills | Status: DC

## 2020-12-15 NOTE — ED Provider Notes (Signed)
HPI  SUBJECTIVE:  Dominique Navarro is a 30 y.o. female who presents with 11 days of right ear pain that radiates into her jaw.  She describes it as throbbing, sharp, constant.  She states that she now has similar pain in her left ear.  She reports nasal congestion, rhinorrhea, ear popping with yawning, and states that she grinds her teeth at night.  Also reports swimming on a recent vacation.  No foreign body insertion.  No fevers, change in hearing, otorrhea.  No antibiotics in the past month.  No antipyretic in the past 6 hours.  Patient has been taking Tylenol, aspirin, Excedrin without improvement in her symptoms.  Symptoms are worse with yawning, chewing, lying down on her right side.  She has a past medical history of seasonal allergies that are bothering her now.  No history of frequent otitis media, TMJ arthralgia.  LMP: 2015.  Denies possibility being pregnant.  PMD: [facility].   Past Medical History:  Diagnosis Date  . Depression   . Environmental allergies   . Migraine     Past Surgical History:  Procedure Laterality Date  . CYST REMOVAL NECK      Family History  Problem Relation Age of Onset  . Asthma Mother   . COPD Mother   . COPD Father   . Seizures Son     Social History   Tobacco Use  . Smoking status: Former Smoker    Types: Cigarettes    Quit date: 02/23/2019    Years since quitting: 1.8  . Smokeless tobacco: Never Used  Vaping Use  . Vaping Use: Never used  Substance Use Topics  . Alcohol use: Never  . Drug use: Never    No current facility-administered medications for this encounter.  Current Outpatient Medications:  .  albuterol (VENTOLIN HFA) 108 (90 Base) MCG/ACT inhaler, Inhale 2 puffs into the lungs every 4 (four) hours as needed for wheezing or shortness of breath., Disp: 18 g, Rfl: 0 .  azelastine (ASTELIN) 0.1 % nasal spray, SMARTSIG:1 Spray(s) Both Nares Twice Daily PRN, Disp: , Rfl:  .  busPIRone (BUSPAR) 7.5 MG tablet, Take 1  tablet (7.5 mg total) by mouth 2 (two) times daily., Disp: 180 tablet, Rfl: 0 .  cyclobenzaprine (FLEXERIL) 10 MG tablet, Take 1 tablet (10 mg total) by mouth at bedtime., Disp: 20 tablet, Rfl: 0 .  DULoxetine (CYMBALTA) 20 MG capsule, Take 2 capsules (40 mg total) by mouth daily., Disp: 180 capsule, Rfl: 0 .  EPINEPHrine 0.3 mg/0.3 mL IJ SOAJ injection, Inject 0.3 mLs (0.3 mg total) into the muscle as needed for anaphylaxis., Disp: 1 each, Rfl: 0 .  fluticasone (FLONASE) 50 MCG/ACT nasal spray, Place into both nostrils., Disp: , Rfl:  .  ibuprofen (ADVIL) 600 MG tablet, Take 1 tablet (600 mg total) by mouth every 6 (six) hours as needed., Disp: 30 tablet, Rfl: 0 .  ipratropium (ATROVENT) 0.06 % nasal spray, Place 2 sprays into both nostrils 4 (four) times daily., Disp: 15 mL, Rfl: 12 .  metoprolol succinate (TOPROL-XL) 50 MG 24 hr tablet, Take 50 mg by mouth daily., Disp: , Rfl:  .  montelukast (SINGULAIR) 10 MG tablet, TAKE 1 TABLET BY MOUTH EVERYDAY AT BEDTIME, Disp: 90 tablet, Rfl: 3 .  Multiple Vitamin (MULTIVITAMIN WITH MINERALS) TABS tablet, Take 1 tablet by mouth daily., Disp: , Rfl:  .  nortriptyline (PAMELOR) 50 MG capsule, Take 50 mg by mouth at bedtime., Disp: , Rfl:  .  predniSONE (DELTASONE) 20 MG tablet, Take 2 tablets (40 mg total) by mouth daily with breakfast for 5 days., Disp: 10 tablet, Rfl: 0 .  rizatriptan (MAXALT) 10 MG tablet, Take 10 mg by mouth 2 (two) times daily as needed., Disp: , Rfl:  .  mupirocin ointment (BACTROBAN) 2 %, Apply 1 application topically 2 (two) times daily., Disp: 22 g, Rfl: 0  Allergies  Allergen Reactions  . Elemental Sulfur Hives and Other (See Comments)    Other reaction(s): Other (See Comments) Other reaction(s): Other (See Comments)  . Bee Venom Hives  . Sulfa Antibiotics Hives  . Topiramate Palpitations     ROS  As noted in HPI.   Physical Exam  BP 99/80 (BP Location: Left Arm)   Pulse 81   Temp 98.7 F (37.1 C) (Oral)   Resp  18   Ht 5\' 4"  (1.626 m)   Wt 73 kg   LMP  (LMP Unknown)   SpO2 100%   BMI 27.64 kg/m   Constitutional: Well developed, well nourished, no acute distress Eyes:  EOMI, conjunctiva normal bilaterally HENT: Normocephalic, atraumatic,mucus membranes moist.  Bilateral external ears normal.  No pain with traction on pinna, no mastoid tenderness bilaterally.  Positive tenderness to palpation of the right tragus.  No crepitus with jaw opening/closing.  Tenderness over the right TMJ.  Bilateral EACs and TMs normal.  No right EAC swelling, debris.  TM intact.  Dentition normal, nontender.   Neck: No cervical lymphadenopathy Respiratory: Normal inspiratory effort Cardiovascular: Normal rate GI: nondistended skin: No rash, skin intact Musculoskeletal: no deformities Neurologic: Alert & oriented x 3, no focal neuro deficits Psychiatric: Speech and behavior appropriate   ED Course   Medications - No data to display  No orders of the defined types were placed in this encounter.   No results found for this or any previous visit (from the past 24 hour(s)). No results found.  ED Clinical Impression  1. Arthralgia of right temporomandibular joint      ED Assessment/Plan  Presentation consistent with TMJ arthralgia.  Will send home with Flexeril, Tylenol/ibuprofen, liquid/soft diet, 40 mg of prednisone for 5 days for inflammation because she has had symptoms for 11 days and they are getting worse. She has follow-up with her dentist Monday.   Discussed , MDM, treatment plan, and plan for follow-up with patient. patient agrees with plan.   Meds ordered this encounter  Medications  . cyclobenzaprine (FLEXERIL) 10 MG tablet    Sig: Take 1 tablet (10 mg total) by mouth at bedtime.    Dispense:  20 tablet    Refill:  0  . ibuprofen (ADVIL) 600 MG tablet    Sig: Take 1 tablet (600 mg total) by mouth every 6 (six) hours as needed.    Dispense:  30 tablet    Refill:  0  . predniSONE  (DELTASONE) 20 MG tablet    Sig: Take 2 tablets (40 mg total) by mouth daily with breakfast for 5 days.    Dispense:  10 tablet    Refill:  0      *This clinic note was created using [employer]. Therefore, there may be occasional mistakes despite careful proofreading.  ?    Melynda Ripple, MD 12/15/20 1747

## 2020-12-15 NOTE — ED Triage Notes (Signed)
Pt c/o right ear pain for over a week, now also having pain in the left ear and some jaw pain. Pt does report seasonal allergy nasal congestion, pt does take allergy med. Pt denies f/n/v/d or other symptoms.

## 2020-12-15 NOTE — Discharge Instructions (Addendum)
Take 600 mg of ibuprofen combined with 1000 mg of Tylenol together 3-4 times a day as needed for pain.  Flexeril at night will help you stop grinding your teeth or clenching your jaw.  Soft/liquid diet for the next 3 to 5 days.  Follow-up with your dentist to get a bite guard.

## 2021-01-01 ENCOUNTER — Ambulatory Visit: Payer: Self-pay | Admitting: Physician Assistant

## 2021-01-30 ENCOUNTER — Telehealth: Payer: Self-pay | Admitting: Physician Assistant

## 2021-01-30 DIAGNOSIS — F32A Depression, unspecified: Secondary | ICD-10-CM

## 2021-01-30 DIAGNOSIS — G5603 Carpal tunnel syndrome, bilateral upper limbs: Secondary | ICD-10-CM

## 2021-01-30 DIAGNOSIS — Z9109 Other allergy status, other than to drugs and biological substances: Secondary | ICD-10-CM

## 2021-01-30 MED ORDER — DULOXETINE HCL 20 MG PO CPEP: 40.0000 mg | ORAL_CAPSULE | Freq: Every day | ORAL | 3 refills | Status: DC

## 2021-01-30 MED ORDER — MONTELUKAST SODIUM 10 MG PO TABS: ORAL_TABLET | ORAL | 3 refills | Status: DC

## 2021-01-30 NOTE — Telephone Encounter (Signed)
Medication Refill - Medication: DULoxetine (CYMBALTA) 20 MG capsule  busPIRone (BUSPAR) 7.5 MG tablet   montelukast (SINGULAIR) 10 MG tablet   meloxicam (MOBIC) 15 MG tablet  Pt is out of all medications and needs refill asap/ pt states pharmacy has sent fax several times   Has the patient contacted their pharmacy? Yes.   (Agent: If no, request that the patient contact the pharmacy for the refill.) (Agent: If yes, when and what did the pharmacy advise?)no response   Preferred Pharmacy (with phone number or street name): [facility] [place] [place] [facility] [place]  [address], [place] [address]  Phone:  [phone] Fax:  [phone]   Agent: Please be advised that RX refills may take up to 3 business days. We ask that you follow-up with your pharmacy.

## 2021-01-30 NOTE — Telephone Encounter (Signed)
   Notes to clinic: Patient is also requesting Mobic and Buspar but or not on current medication list Patient states that she is out all these meds   Requested Prescriptions  Pending Prescriptions Disp Refills   DULoxetine (CYMBALTA) 20 MG capsule 180 capsule 0    Sig: Take 2 capsules (40 mg total) by mouth daily.      Psychiatry: Antidepressants - SNRI Passed - 01/30/2021 12:01 PM      Passed - Completed PHQ-2 or PHQ-9 in the last 360 days      Passed - Last BP in normal range    BP Readings from Last 1 Encounters:  12/15/20 99/80          Passed - Valid encounter within last 6 months    Recent Outpatient Visits           3 months ago Complication of left ear piercing, initial encounter   [place], [place], PA-C   3 months ago Depression complicating pregnancy, antepartum   [place], [place], PA-C   8 months ago Nexplanon in place   [place], [place], [place]   11 months ago Rapid palpitations   [place], Clearnce Sorrel, [place]   1 year ago Encounter for initial prescription of [facility], Dionne Bucy, MD       Future Appointments             In 1 month Ralene Bathe, MD [place]               montelukast (SINGULAIR) 10 MG tablet 90 tablet 3    Sig: TAKE 1 TABLET BY MOUTH EVERYDAY AT BEDTIME      Pulmonology:  Leukotriene Inhibitors Passed - 01/30/2021 12:01 PM      Passed - Valid encounter within last 12 months    Recent Outpatient Visits           3 months ago Complication of left ear piercing, initial encounter   [place], Wendee [place], PA-C   3 months ago Depression complicating pregnancy, antepartum   [place], [place], PA-C   8 months ago Nexplanon in place   [place], [place], [place]   11 months ago Rapid palpitations    [facility] [place], Clearnce Sorrel, [place]   1 year ago Encounter for initial prescription of [place], Dionne Bucy, MD       Future Appointments             In 1 month Nehemiah Massed Monia Sabal, MD [place]

## 2021-02-05 MED ORDER — BUSPIRONE HCL 7.5 MG PO TABS
7.5000 mg | ORAL_TABLET | Freq: Two times a day (BID) | ORAL | 3 refills | Status: DC
Start: 2021-02-05 — End: 2022-03-13

## 2021-02-05 MED ORDER — MELOXICAM 15 MG PO TABS
1.0000 | ORAL_TABLET | Freq: Every day | ORAL | 2 refills | Status: DC | PRN
Start: 2021-02-05 — End: 2022-04-08

## 2021-02-05 NOTE — Addendum Note (Signed)
Addended by: Birdie Sons on: 02/05/2021 02:16 PM   Modules accepted: Orders

## 2021-02-05 NOTE — Telephone Encounter (Signed)
Pt has received refills for 2 out the 4 meds she requested / she stated she has been out of medication for a week and called to follow up on the refills for busPIRone (BUSPAR) 7.5 MG tablet  And meloxicam (MOBIC) 15 MG tablet  Please call pt and advise today

## 2021-03-28 ENCOUNTER — Ambulatory Visit: Payer: Medicaid Other | Admitting: Dermatology

## 2021-03-28 ENCOUNTER — Encounter: Payer: Self-pay | Admitting: Dermatology

## 2021-03-28 ENCOUNTER — Other Ambulatory Visit: Payer: Self-pay

## 2021-03-28 DIAGNOSIS — D485 Neoplasm of uncertain behavior of skin: Secondary | ICD-10-CM

## 2021-03-28 DIAGNOSIS — D229 Melanocytic nevi, unspecified: Secondary | ICD-10-CM

## 2021-03-28 DIAGNOSIS — L700 Acne vulgaris: Secondary | ICD-10-CM

## 2021-03-28 DIAGNOSIS — D2261 Melanocytic nevi of right upper limb, including shoulder: Secondary | ICD-10-CM | POA: Diagnosis not present

## 2021-03-28 DIAGNOSIS — D492 Neoplasm of unspecified behavior of bone, soft tissue, and skin: Secondary | ICD-10-CM

## 2021-03-28 MED ORDER — ADAPALENE-BENZOYL PEROXIDE 0.3-2.5 % EX GEL: 1.0000 "application " | Freq: Every day | CUTANEOUS | 3 refills | Status: AC

## 2021-03-28 MED ORDER — DIFFERIN 0.3 % EX GEL: 1.0000 "application " | Freq: Every morning | CUTANEOUS | 3 refills | Status: DC

## 2021-03-28 NOTE — Progress Notes (Signed)
New Patient Visit  Subjective  Dominique Navarro is a 30 y.o. female who presents for the following: Nevus (Arms, and legs, some getting larger) and Acne (Face, ~14yr, using otc products).  New patient referral from ACarles Collet PA-C.  The following portions of the chart were reviewed this encounter and updated as appropriate:   Tobacco  Allergies  Meds  Problems  Med Hx  Surg Hx  Fam Hx     Review of Systems:  No other skin or systemic complaints except as noted in HPI or Assessment and Plan.  Objective  Well appearing patient in no apparent distress; mood and affect are within normal limits.  A focused examination was performed including arms, legs. Relevant physical exam findings are noted in the Assessment and Plan.  R mid lateral volar forearm Slightly irregular brown macule 0.5cm  R ant thigh 0.5cm slightly irregular brown macule  face Crusted paps mostly perioral area   Assessment & Plan  Neoplasm of skin (2) R mid lateral volar forearm  Epidermal / dermal shaving  Lesion diameter (cm):  0.5 Informed consent: discussed and consent obtained   Timeout: patient name, date of birth, surgical site, and procedure verified   Procedure prep:  Patient was prepped and draped in usual sterile fashion Prep type:  Isopropyl alcohol Anesthesia: the lesion was anesthetized in a standard fashion   Anesthetic:  1% lidocaine w/ epinephrine 1-100,000 buffered w/ 8.4% NaHCO3 Instrument used: flexible razor blade   Hemostasis achieved with: pressure, aluminum chloride and electrodesiccation   Outcome: patient tolerated procedure well   Post-procedure details: sterile dressing applied and wound care instructions given   Dressing type: bandage and bacitracin    R ant thigh  Epidermal / dermal shaving  Lesion diameter (cm):  0.5 Informed consent: discussed and consent obtained   Timeout: patient name, date of birth, surgical site, and procedure verified   Procedure prep:   Patient was prepped and draped in usual sterile fashion Prep type:  Isopropyl alcohol Anesthesia: the lesion was anesthetized in a standard fashion   Anesthetic:  1% lidocaine w/ epinephrine 1-100,000 buffered w/ 8.4% NaHCO3 Instrument used: flexible razor blade   Hemostasis achieved with: pressure, aluminum chloride and electrodesiccation   Outcome: patient tolerated procedure well   Post-procedure details: sterile dressing applied and wound care instructions given   Dressing type: bandage and bacitracin    R mid lat volar forearm - Nevus vs Dysplastic Nevus, bx and shave removal today  R ant thigh - Nevus vs Dysplastic Nevus, bx and shave removal today  Related Procedures Anatomic Pathology Report  Acne vulgaris face  Chronic, persistent  Start Epiduo forte qhs to face  Topical retinoid medications like tretinoin/Retin-A, adapalene/Differin, tazarotene/Fabior, and Epiduo/Epiduo Forte can cause dryness and irritation when first started. Only apply a pea-sized amount to the entire affected area. Avoid applying it around the eyes, edges of mouth and creases at the nose. If you experience irritation, use a good moisturizer first and/or apply the medicine less often. If you are doing well with the medicine, you can increase how often you use it until you are applying every night. Be careful with sun protection while using this medication as it can make you sensitive to the sun. This medicine should not be used by pregnant women.     Adapalene-Benzoyl Peroxide (EPIDUO FORTE) 0.3-2.5 % GEL - face Apply 1 application topically at bedtime. Pea size amount to face nightly for acne  Melanocytic Nevi - Tan-brown and/or pink-flesh-colored  symmetric macules and papules - Benign appearing on exam today - Observation - Call clinic for new or changing moles - Recommend daily use of broad spectrum spf 30+ sunscreen to sun-exposed areas.   Return in about 3 months (around 06/28/2021) for Acne  f/u.  I, Dominique Navarro, RMA, am acting as scribe for Sarina Ser, MD . Documentation: I have reviewed the above documentation for accuracy and completeness, and I agree with the above.  Sarina Ser, MD

## 2021-03-28 NOTE — Patient Instructions (Addendum)
If you have any questions or concerns for your doctor, please call our main line at [phone] and press option 4 to reach your doctor's medical assistant. If no one answers, please leave a voicemail as directed and we will return your call as soon as possible. Messages left after 4 pm will be answered the following business day.   You may also send us a message via MyChart. We typically respond to MyChart messages within 1-2 business days.  For prescription refills, please ask your pharmacy to contact our office. Our fax number is [phone].  If you have an urgent issue when the clinic is closed that cannot wait until the next business day, you can page your doctor at the number below.    Please note that while we do our best to be available for urgent issues outside of office hours, we are not available 24/7.   If you have an urgent issue and are unable to reach us, you may choose to seek medical care at your doctor's office, retail clinic, urgent care center, or emergency room.  If you have a medical emergency, please immediately call 911 or go to the emergency department.  Pager Numbers  - Dr. Kowalski: [phone]  - Dr. Moye: [phone]  - Dr. Stewart: [phone]  In the event of inclement weather, please call our main line at [phone] for an update on the status of any delays or closures.  Dermatology Medication Tips: Please keep the boxes that topical medications come in in order to help keep track of the instructions about where and how to use these. Pharmacies typically print the medication instructions only on the boxes and not directly on the medication tubes.   If your medication is too expensive, please contact our office at [phone] option 4 or send us a message through MyChart.   We are unable to tell what your co-pay for medications will be in advance as this is different depending on your insurance coverage. However, we may be able to find a substitute  medication at lower cost or fill out paperwork to get insurance to cover a needed medication.   If a prior authorization is required to get your medication covered by your insurance company, please allow us 1-2 business days to complete this process.  Drug prices often vary depending on where the prescription is filled and some pharmacies may offer cheaper prices.  The website www.goodrx.com contains coupons for medications through different pharmacies. The prices here do not account for what the cost may be with help from insurance (it may be cheaper with your insurance), but the website can give you the price if you did not use any insurance.  - You can print the associated coupon and take it with your prescription to the pharmacy.  - You may also stop by our office during regular business hours and pick up a GoodRx coupon card.  - If you need your prescription sent electronically to a different pharmacy, notify our office through [facility] MyChart or by phone at [phone] option 4.   Wound Care Instructions  Cleanse wound gently with soap and water once a day then pat dry with clean gauze. Apply a thing coat of Petrolatum (petroleum jelly, "Vaseline") over the wound (unless you have an allergy to this). We recommend that you use a new, sterile tube of Vaseline. Do not pick or remove scabs. Do not remove the yellow or white "healing tissue" from the base of the wound.  Cover the   wound with fresh, clean, nonstick gauze and secure with paper tape. You may use Band-Aids in place of gauze and tape if the would is small enough, but would recommend trimming much of the tape off as there is often too much. Sometimes Band-Aids can irritate the skin.  You should call the office for your biopsy report after 1 week if you have not already been contacted.  If you experience any problems, such as abnormal amounts of bleeding, swelling, significant bruising, significant pain, or evidence of infection,  please call the office immediately.  FOR ADULT SURGERY PATIENTS: If you need something for pain relief you may take 1 extra strength Tylenol (acetaminophen) AND 2 Ibuprofen (200mg each) together every 4 hours as needed for pain. (do not take these if you are allergic to them or if you have a reason you should not take them.) Typically, you may only need pain medication for 1 to 3 days.    

## 2021-03-29 ENCOUNTER — Encounter: Payer: Self-pay | Admitting: Emergency Medicine

## 2021-03-29 ENCOUNTER — Ambulatory Visit
Admission: EM | Admit: 2021-03-29 | Discharge: 2021-03-29 | Disposition: A | Discharge status: Home / Self Care | Payer: Medicaid Other | Attending: Sports Medicine | Admitting: Sports Medicine

## 2021-03-29 ENCOUNTER — Other Ambulatory Visit: Payer: Self-pay

## 2021-03-29 ENCOUNTER — Ambulatory Visit (INDEPENDENT_AMBULATORY_CARE_PROVIDER_SITE_OTHER): Payer: Medicaid Other

## 2021-03-29 DIAGNOSIS — R200 Anesthesia of skin: Secondary | ICD-10-CM

## 2021-03-29 DIAGNOSIS — M5412 Radiculopathy, cervical region: Secondary | ICD-10-CM | POA: Diagnosis not present

## 2021-03-29 DIAGNOSIS — M25531 Pain in right wrist: Secondary | ICD-10-CM

## 2021-03-29 DIAGNOSIS — R202 Paresthesia of skin: Secondary | ICD-10-CM

## 2021-03-29 MED ORDER — METHYLPREDNISOLONE SODIUM SUCC 125 MG IJ SOLR
125.0000 mg | Freq: Once | INTRAMUSCULAR | Status: AC
  Administered 2021-03-29: 125 mg via INTRAMUSCULAR

## 2021-03-29 NOTE — ED Provider Notes (Signed)
[facility]    CSN: [id number] Arrival date & time: 03/29/21  1708      History   Chief Complaint Chief Complaint  Patient presents with   Wrist Pain    right    HPI Dominique Navarro is a 30 y.o. female.   30 year old right-hand-dominant female who presents for evaluation of numbness and tingling that is going down her right arm.  She says that her right hand and wrist are quite painful.  She is actually being followed by orthopedics and had bilateral carpal tunnel injections done.  She says the one on the left did help her but her symptoms on the right have not resolved and now involve her right arm.  A subsequent MRI of her cervical spine has been ordered but it is not scheduled to be done until Monday.  She contacted the orthopedic office and they called in a prescription for oral prednisone.  She is concerned because she works over at [employer] and does not feel like she can do her job 100%.  She does not want to be out of work but is asking for light duties.  She denies any accidents trauma or falls.  She does have pain in the wrist with movement.  She has purchased an over-the-counter cock-up wrist splint.  She is trying to get 1 that involves her thumb as when she moves that it does bother her as well.  On further history appears as though she does report that her neck does bother her and she does have some muscle spasm.  She denies any vision changes.  She is not dropping any objects.  She says that she is just having a hard time using that arm secondary to pain.  She denies any chest pain shortness of breath.  No urinary or abdominal symptoms.  No red flag signs or symptoms elicited on history.   Past Medical History:  Diagnosis Date   Depression    Environmental allergies    Migraine     Patient Active Problem List   Diagnosis Date Noted   Intractable migraine with aura without status migrainosus 09/16/2019   Arthralgia 06/06/2019   Osteoporosis screening 06/06/2019    Other insomnia 06/06/2019   Migraine headache 05/14/2018   Advance directive discussed with patient 09/02/2013   Depression complicating pregnancy, antepartum 04/29/2013   FHx: epilepsy 04/29/2013   Medication exposure during first trimester of pregnancy 04/29/2013    Past Surgical History:  Procedure Laterality Date   CYST REMOVAL NECK      OB History     Gravida  3   Para  2   Term  2   Preterm      AB  1   Living  2      SAB  1   IAB      Ectopic      Multiple      Live Births  2            Home Medications    Prior to Admission medications   Medication Sig Start Date End Date Taking? Authorizing Provider  busPIRone (BUSPAR) 7.5 MG tablet Take 1 tablet (7.5 mg total) by mouth 2 (two) times daily. 02/05/21  Yes Birdie Sons, MD  DULoxetine (CYMBALTA) 20 MG capsule Take 2 capsules (40 mg total) by mouth daily. 01/30/21  Yes Birdie Sons, MD  fluticasone (FLONASE) 50 MCG/ACT nasal spray Place into both nostrils. 05/28/20  Yes [provider]  meloxicam (  MOBIC) 15 MG tablet Take 1 tablet (15 mg total) by mouth daily as needed for pain. 02/05/21  Yes Birdie Sons, MD  metoprolol succinate (TOPROL-XL) 50 MG 24 hr tablet Take 50 mg by mouth daily. 12/12/20  Yes [provider]  montelukast (SINGULAIR) 10 MG tablet TAKE 1 TABLET BY MOUTH EVERYDAY AT BEDTIME 01/30/21  Yes Birdie Sons, MD  nortriptyline (PAMELOR) 50 MG capsule Take 50 mg by mouth at bedtime. 03/11/20  Yes [provider]  rizatriptan (MAXALT) 10 MG tablet Take 10 mg by mouth 2 (two) times daily as needed. 05/13/20  Yes [provider]  promethazine (PHENERGAN) 12.5 MG tablet Take 1 tablet (12.5 mg total) by mouth every 8 (eight) hours as needed for nausea or vomiting. 08/31/19 12/15/20  Trinna Post, PA-C  Adapalene-Benzoyl Peroxide (EPIDUO FORTE) 0.3-2.5 % GEL Apply 1 application topically at bedtime. Pea size amount to face nightly for acne 03/28/21    Ralene Bathe, MD  albuterol (VENTOLIN HFA) 108 (90 Base) MCG/ACT inhaler Inhale 2 puffs into the lungs every 4 (four) hours as needed for wheezing or shortness of breath. 03/23/20   Sharion Balloon, NP  azelastine (ASTELIN) 0.1 % nasal spray SMARTSIG:1 Spray(s) Both Nares Twice Daily PRN 04/24/20   [provider]  cyclobenzaprine (FLEXERIL) 10 MG tablet Take 1 tablet (10 mg total) by mouth at bedtime. 12/15/20   Melynda Ripple, MD  EPINEPHrine 0.3 mg/0.3 mL IJ SOAJ injection Inject 0.3 mLs (0.3 mg total) into the muscle as needed for anaphylaxis. 12/02/19   Trinna Post, PA-C  ibuprofen (ADVIL) 600 MG tablet Take 1 tablet (600 mg total) by mouth every 6 (six) hours as needed. 12/15/20   Melynda Ripple, MD  ipratropium (ATROVENT) 0.06 % nasal spray Place 2 sprays into both nostrils 4 (four) times daily. 10/30/20   Margarette Canada, NP  Multiple Vitamin (MULTIVITAMIN WITH MINERALS) TABS tablet Take 1 tablet by mouth daily.    [provider]  mupirocin ointment (BACTROBAN) 2 % Apply 1 application topically 2 (two) times daily. 10/11/20   Trinna Post, PA-C  amitriptyline (ELAVIL) 25 MG tablet Take 1 tablet (25 mg total) by mouth at bedtime. 08/31/19 01/07/20  Trinna Post, PA-C    Family History Family History  Problem Relation Age of Onset   Asthma Mother    COPD Mother    COPD Father    Seizures Son     Social History Social History   Tobacco Use   Smoking status: Former    Types: Cigarettes    Quit date: 02/23/2019    Years since quitting: 2.0   Smokeless tobacco: Never  Vaping Use   Vaping Use: Never used  Substance Use Topics   Alcohol use: Never   Drug use: Never     Allergies   Elemental sulfur, Bee venom, Sulfa antibiotics, and Topiramate   Review of Systems Review of Systems  Constitutional:  Positive for activity change. Negative for appetite change, chills, diaphoresis, fatigue and fever.  HENT:  Negative for congestion, ear pain,  postnasal drip, rhinorrhea, sinus pressure, sinus pain, sneezing and sore throat.   Eyes:  Negative for pain.  Respiratory:  Negative for cough, chest tightness and shortness of breath.   Cardiovascular:  Negative for chest pain and palpitations.  Gastrointestinal:  Negative for abdominal pain, diarrhea, nausea and vomiting.  Genitourinary:  Negative for dysuria.  Musculoskeletal:  Positive for arthralgias, neck pain and neck stiffness. Negative for back  pain and myalgias.  Skin:  Negative for color change, pallor, rash and wound.  Neurological:  Positive for weakness and numbness. Negative for dizziness, seizures, syncope, light-headedness and headaches.  All other systems reviewed and are negative.   Physical Exam Triage Vital Signs ED Triage Vitals  Enc Vitals Group     BP 03/29/21 1717 100/69     Pulse Rate 03/29/21 1717 93     Resp 03/29/21 1717 14     Temp 03/29/21 1717 98.6 F (37 C)     Temp Source 03/29/21 1717 Oral     SpO2 03/29/21 1717 98 %     Weight 03/29/21 1714 170 lb (77.1 kg)     Height 03/29/21 1714 '5\' 4"'$  (1.626 m)     Head Circumference --      Peak Flow --      Pain Score 03/29/21 1714 10     Pain Loc --      Pain Edu? --      Excl. in [place]? --    No data found.  Updated Vital Signs BP 100/69 (BP Location: Right Arm)   Pulse 93   Temp 98.6 F (37 C) (Oral)   Resp 14   Ht '5\' 4"'$  (1.626 m)   Wt 77.1 kg   SpO2 98%   BMI 29.18 kg/m   Visual Acuity Right Eye Distance:   Left Eye Distance:   Bilateral Distance:    Right Eye Near:   Left Eye Near:    Bilateral Near:     Physical Exam Vitals and nursing note reviewed.  Constitutional:      General: She is not in acute distress.    Appearance: Normal appearance. She is not ill-appearing, toxic-appearing or diaphoretic.     Comments: She is uncomfortable and cries at times during the history.  HENT:     Head: Normocephalic and atraumatic.     Nose: Nose normal.     Mouth/Throat:     Mouth:  Mucous membranes are moist.  Eyes:     Conjunctiva/sclera: Conjunctivae normal.     Pupils: Pupils are equal, round, and reactive to light.  Cardiovascular:     Rate and Rhythm: Normal rate and regular rhythm.     Pulses: Normal pulses.     Heart sounds: Normal heart sounds. No murmur heard.   No friction rub. No gallop.  Pulmonary:     Effort: Pulmonary effort is normal.     Breath sounds: Normal breath sounds. No stridor. No wheezing, rhonchi or rales.  Musculoskeletal:     Cervical back: Normal range of motion and neck supple.     Comments: Cervical spine: There is tenderness to palpation emanating from the base of the occiput on the right down into the trapezius and the medial scapular border.  There is associated paraspinous muscle spasm.  She has adequate range of motion.  That said with extension she does have increased symptoms down the right arm.  Spurling's test is positive on the right side negative on the left side.  Strength seems well-preserved without any focal weakness. Bilateral upper extremities: She has adequate range of motion at the shoulders elbow hand and wrist without any focal weakness appreciated on exam.  She does have some subjective paresthesia that goes down the C5-6 and C6-7 nerve root distributions.  Skin:    General: Skin is warm and dry.     Capillary Refill: Capillary refill takes less than 2 seconds.  Coloration: Skin is not jaundiced.     Findings: No bruising, erythema or rash.  Neurological:     General: No focal deficit present.     Mental Status: She is alert and oriented to person, place, and time.     GCS: GCS eye subscore is 4. GCS verbal subscore is 5. GCS motor subscore is 6.     Deep Tendon Reflexes: Reflexes are normal and symmetric.     UC Treatments / Results  Labs (all labs ordered are listed, but only abnormal results are displayed) Labs Reviewed - No data to display  EKG   Radiology DG Wrist Complete Right  Result Date:  03/29/2021 CLINICAL DATA:  Right wrist pain EXAM: RIGHT WRIST - COMPLETE 3+ VIEW COMPARISON:  None. FINDINGS: Cyst within the distal scaphoid appears benign. No acute bony abnormality. Specifically, no fracture, subluxation, or dislocation. Joint spaces maintained. IMPRESSION: No acute bony abnormality. Electronically Signed   By: Rolm Baptise M.D.   On: 03/29/2021 17:56    Procedures Procedures (including critical care time)  Medications Ordered in UC Medications  methylPREDNISolone sodium succinate (SOLU-MEDROL) 125 mg/2 mL injection 125 mg (125 mg Intramuscular Given 03/29/21 1833)    Initial Impression / Assessment and Plan / UC Course  I have reviewed the triage vital signs and the nursing notes.  Pertinent labs & imaging results that were available during my care of the patient were reviewed by me and considered in my medical decision making (see chart for details).  Clinical impression: 1.  Right-sided neck pain with cervical radiculitis. 2.  Right arm numbness and tingling. 3.  Right wrist pain with a history of carpal tunnel syndrome although has negative EMG and nerve conduction studies.  Treatment plan: 1.  The findings and treatment plan were discussed in detail with the patient.  Patient was in agreement. 2.  I do agree with the prednisone that the orthopedic group prescribed her.  Given its late in the day and she has not picked that up I went ahead and gave her a steroid shot and she can start the oral prednisone tomorrow. 3.  Given her pain I went ahead and ordered a x-ray of her wrist.  There was no acute findings.  There was an incidental finding of a cyst in the scaphoid.  Official results are above.  She needs to follow-up for MRI on Monday. 4.  While she is taking the oral steroids advise no Motrin, Advil, ibuprofen, Naprosyn, Aleve or aspirin products.  She can take over-the-counter Tylenol. 5.  I did not give her any narcotics today. 6.  I did provide her a work note  with limited work duties.  No pushing pulling lifting greater than 10 pounds without right arm until she is reevaluated by orthopedics after MRI. 7.  I did indicate the ER was an option if she did not feel her pain was being adequately controlled. 8.  She was stable upon discharge and will follow-up here as needed.    Final Clinical Impressions(s) / UC Diagnoses   Final diagnoses:  Right wrist pain  Cervical radiculitis  Numbness and tingling of right upper extremity     Discharge Instructions      As we discussed, I believe most your symptoms are coming from your neck.  You have an MRI scheduled for Monday.  Your orthopedic group has prescribed you oral prednisone.  I went ahead and prescribed a steroid shot today.  Please do not take the oral steroids  today.  You can start that tomorrow. No Motrin, Advil, ibuprofen, Naprosyn, or Aleve.  You can take some Tylenol over-the-counter. I did provide you a work note with limited work duties until you are seen and evaluated by orthopedics after your MRI. Educational handouts provided. If your pain worsens despite the prednisone and you cannot wait, your next option is the emergency room.      ED Prescriptions   None    PDMP not reviewed this encounter.   Verda Cumins, MD 03/30/21 [phone]

## 2021-03-29 NOTE — ED Triage Notes (Signed)
Patient states that when she holds her right hand out her wrist is crocked.  Patient reports pain when she moves it.  Patient reports history of carpel tunnel.  Patient denies any injury or fall.

## 2021-03-29 NOTE — Discharge Instructions (Addendum)
As we discussed, I believe most your symptoms are coming from your neck.  You have an MRI scheduled for Monday.  Your orthopedic group has prescribed you oral prednisone.  I went ahead and prescribed a steroid shot today.  Please do not take the oral steroids today.  You can start that tomorrow. No Motrin, Advil, ibuprofen, Naprosyn, or Aleve.  You can take some Tylenol over-the-counter. I did provide you a work note with limited work duties until you are seen and evaluated by orthopedics after your MRI. Educational handouts provided. If your pain worsens despite the prednisone and you cannot wait, your next option is the emergency room.

## 2021-04-03 ENCOUNTER — Telehealth: Payer: Self-pay

## 2021-04-03 LAB — ANATOMIC PATHOLOGY REPORT

## 2021-04-03 NOTE — Telephone Encounter (Signed)
-----   Message from Ralene Bathe, MD sent at 04/03/2021  1:21 PM EDT ----- Diagnosis synopsis: Comment  Comment: Specimen A-Skin Biopsy, right mid lateral volar forearm:  JUNCTIONAL MELANOCYTIC NEVUS. MARGINS CLEAR.  Specimen B-Skin Biopsy, right anterior thigh: LENTIGO. NO  EVIDENCE OF MALIGNANCY  1/A- Benign mole No further treatment needed 2/B- benign "freckle" No further treatment needed

## 2021-04-03 NOTE — Telephone Encounter (Signed)
Advised patient of results/hd  

## 2021-04-28 ENCOUNTER — Encounter: Payer: Self-pay | Admitting: Emergency Medicine

## 2021-04-28 ENCOUNTER — Ambulatory Visit
Admission: EM | Admit: 2021-04-28 | Discharge: 2021-04-28 | Disposition: A | Discharge status: Home / Self Care | Payer: Medicaid Other | Attending: Physician Assistant | Admitting: Physician Assistant

## 2021-04-28 ENCOUNTER — Other Ambulatory Visit: Payer: Self-pay

## 2021-04-28 DIAGNOSIS — J029 Acute pharyngitis, unspecified: Secondary | ICD-10-CM

## 2021-04-28 DIAGNOSIS — Z20822 Contact with and (suspected) exposure to covid-19: Secondary | ICD-10-CM | POA: Insufficient documentation

## 2021-04-28 DIAGNOSIS — R059 Cough, unspecified: Secondary | ICD-10-CM | POA: Diagnosis present

## 2021-04-28 DIAGNOSIS — R490 Dysphonia: Secondary | ICD-10-CM

## 2021-04-28 DIAGNOSIS — J069 Acute upper respiratory infection, unspecified: Secondary | ICD-10-CM | POA: Diagnosis present

## 2021-04-28 LAB — POCT RAPID STREP A: Streptococcus, Group A Screen (Direct): NEGATIVE

## 2021-04-28 MED ORDER — LIDOCAINE VISCOUS HCL 2 % MT SOLN: 15.0000 mL | OROMUCOSAL | 0 refills | Status: DC | PRN

## 2021-04-28 NOTE — ED Provider Notes (Signed)
[facility]    CSN: [id number] Arrival date & time: 04/28/21  1313      History   Chief Complaint Chief Complaint  Patient presents with   Sore Throat    HPI Dominique Navarro is a 30 y.o. female presenting for 2-day history of sore throat, painful swallowing, voice hoarseness, congestion, postnasal drainage, and voice hoarseness.  Also admits to a dry cough.  She denies any fever, fatigue, body aches, chest pain, shortness of breath, nausea/vomiting or diarrhea.  No sick contacts and no known exposure to COVID-19.  Not vaccinated for COVID-19.  Has been taking DayQuil for his symptoms.  She is concerned about the possibility of strep throat.  She has no other concerns.  HPI  Past Medical History:  Diagnosis Date   Depression    Environmental allergies    Migraine     Patient Active Problem List   Diagnosis Date Noted   Intractable migraine with aura without status migrainosus 09/16/2019   Arthralgia 06/06/2019   Osteoporosis screening 06/06/2019   Other insomnia 06/06/2019   Migraine headache 05/14/2018   Advance directive discussed with patient 09/02/2013   Depression complicating pregnancy, antepartum 04/29/2013   FHx: epilepsy 04/29/2013   Medication exposure during first trimester of pregnancy 04/29/2013    Past Surgical History:  Procedure Laterality Date   CYST REMOVAL NECK      OB History     Gravida  3   Para  2   Term  2   Preterm      AB  1   Living  2      SAB  1   IAB      Ectopic      Multiple      Live Births  2            Home Medications    Prior to Admission medications   Medication Sig Start Date End Date Taking? Authorizing Provider  lidocaine (XYLOCAINE) 2 % solution Use as directed 15 mLs in the mouth or throat every 3 (three) hours as needed for mouth pain (swish and spit). 04/28/21  Yes Danton Clap, PA-C  Adapalene-Benzoyl Peroxide (EPIDUO FORTE) 0.3-2.5 % GEL Apply 1 application topically at bedtime.  Pea size amount to face nightly for acne 03/28/21   Ralene Bathe, MD  albuterol (VENTOLIN HFA) 108 (90 Base) MCG/ACT inhaler Inhale 2 puffs into the lungs every 4 (four) hours as needed for wheezing or shortness of breath. 03/23/20   Sharion Balloon, NP  azelastine (ASTELIN) 0.1 % nasal spray SMARTSIG:1 Spray(s) Both Nares Twice Daily PRN 04/24/20   [provider]  busPIRone (BUSPAR) 7.5 MG tablet Take 1 tablet (7.5 mg total) by mouth 2 (two) times daily. 02/05/21   Birdie Sons, MD  cyclobenzaprine (FLEXERIL) 10 MG tablet Take 1 tablet (10 mg total) by mouth at bedtime. 12/15/20   Melynda Ripple, MD  DULoxetine (CYMBALTA) 20 MG capsule Take 2 capsules (40 mg total) by mouth daily. 01/30/21   Birdie Sons, MD  EPINEPHrine 0.3 mg/0.3 mL IJ SOAJ injection Inject 0.3 mLs (0.3 mg total) into the muscle as needed for anaphylaxis. 12/02/19   Trinna Post, PA-C  fluticasone (FLONASE) 50 MCG/ACT nasal spray Place into both nostrils. 05/28/20   [provider]  ibuprofen (ADVIL) 600 MG tablet Take 1 tablet (600 mg total) by mouth every 6 (six) hours as needed. 12/15/20   Melynda Ripple, MD  ipratropium (ATROVENT) 0.06 %  nasal spray Place 2 sprays into both nostrils 4 (four) times daily. 10/30/20   Margarette Canada, NP  meloxicam (MOBIC) 15 MG tablet Take 1 tablet (15 mg total) by mouth daily as needed for pain. 02/05/21   Birdie Sons, MD  metoprolol succinate (TOPROL-XL) 50 MG 24 hr tablet Take 50 mg by mouth daily. 12/12/20   [provider]  montelukast (SINGULAIR) 10 MG tablet TAKE 1 TABLET BY MOUTH EVERYDAY AT BEDTIME 01/30/21   Birdie Sons, MD  Multiple Vitamin (MULTIVITAMIN WITH MINERALS) TABS tablet Take 1 tablet by mouth daily.    [provider]  mupirocin ointment (BACTROBAN) 2 % Apply 1 application topically 2 (two) times daily. 10/11/20   Trinna Post, PA-C  nortriptyline (PAMELOR) 50 MG capsule Take 50 mg by mouth at bedtime. 03/11/20   [provider]  rizatriptan (MAXALT) 10 MG tablet Take 10 mg by mouth 2 (two) times daily as needed. 05/13/20   [provider]  amitriptyline (ELAVIL) 25 MG tablet Take 1 tablet (25 mg total) by mouth at bedtime. 08/31/19 01/07/20  Trinna Post, PA-C  promethazine (PHENERGAN) 12.5 MG tablet Take 1 tablet (12.5 mg total) by mouth every 8 (eight) hours as needed for nausea or vomiting. 08/31/19 12/15/20  Trinna Post, PA-C    Family History Family History  Problem Relation Age of Onset   Asthma Mother    COPD Mother    COPD Father    Seizures Son     Social History Social History   Tobacco Use   Smoking status: Former    Types: Cigarettes    Quit date: 02/23/2019    Years since quitting: 2.1   Smokeless tobacco: Never  Vaping Use   Vaping Use: Never used  Substance Use Topics   Alcohol use: Never   Drug use: Never     Allergies   Elemental sulfur, Bee venom, Sulfa antibiotics, and Topiramate   Review of Systems Review of Systems  Constitutional:  Negative for chills, diaphoresis, fatigue and fever.  HENT:  Positive for congestion, postnasal drip, sore throat and voice change. Negative for ear pain, rhinorrhea, sinus pressure and sinus pain.   Respiratory:  Positive for cough. Negative for shortness of breath.   Cardiovascular:  Negative for chest pain.  Gastrointestinal:  Negative for abdominal pain, nausea and vomiting.  Musculoskeletal:  Negative for arthralgias and myalgias.  Skin:  Negative for rash.  Neurological:  Negative for weakness and headaches.  Hematological:  Negative for adenopathy.    Physical Exam Triage Vital Signs ED Triage Vitals  Enc Vitals Group     BP 04/28/21 1327 (!) 126/91     Pulse Rate 04/28/21 1327 (!) 102     Resp 04/28/21 1327 14     Temp 04/28/21 1327 98.4 F (36.9 C)     Temp Source 04/28/21 1327 Oral     SpO2 04/28/21 1327 100 %     Weight 04/28/21 1325 170 lb (77.1 kg)     Height 04/28/21 1325 '5\' 4"'$  (1.626 m)      Head Circumference --      Peak Flow --      Pain Score 04/28/21 1325 5     Pain Loc --      Pain Edu? --      Excl. in [place]? --    No data found.  Updated Vital Signs BP (!) 126/91 (BP Location: Left Arm)   Pulse (!) 102  Temp 98.4 F (36.9 C) (Oral)   Resp 14   Ht '5\' 4"'$  (1.626 m)   Wt 170 lb (77.1 kg)   SpO2 100%   BMI 29.18 kg/m      Physical Exam Vitals and nursing note reviewed.  Constitutional:      General: She is not in acute distress.    Appearance: Normal appearance. She is not ill-appearing or toxic-appearing.     Comments: +voice hoarseness noted  HENT:     Head: Normocephalic and atraumatic.     Nose: Congestion present.     Mouth/Throat:     Mouth: Mucous membranes are moist.     Pharynx: Oropharynx is clear. Posterior oropharyngeal erythema (mild) present.  Eyes:     General: No scleral icterus.       Right eye: No discharge.        Left eye: No discharge.     Conjunctiva/sclera: Conjunctivae normal.  Cardiovascular:     Rate and Rhythm: Normal rate and regular rhythm.     Heart sounds: Normal heart sounds.  Pulmonary:     Effort: Pulmonary effort is normal. No respiratory distress.     Breath sounds: Normal breath sounds.  Musculoskeletal:     Cervical back: Neck supple.  Skin:    General: Skin is dry.  Neurological:     General: No focal deficit present.     Mental Status: She is alert. Mental status is at baseline.     Motor: No weakness.     Gait: Gait normal.  Psychiatric:        Mood and Affect: Mood normal.        Behavior: Behavior normal.        Thought Content: Thought content normal.     UC Treatments / Results  Labs (all labs ordered are listed, but only abnormal results are displayed) Labs Reviewed  SARS CORONAVIRUS 2 (TAT 6-24 HRS)  CULTURE, GROUP A STREP [facility])  POCT RAPID STREP A, ED / UC  POCT RAPID STREP A    EKG   Radiology No results found.  Procedures Procedures (including critical care  time)  Medications Ordered in UC Medications - No data to display  Initial Impression / Assessment and Plan / UC Course  I have reviewed the triage vital signs and the nursing notes.  Pertinent labs & imaging results that were available during my care of the patient were reviewed by me and considered in my medical decision making (see chart for details).  30 year old female presenting for 2-day history of sore throat, postnasal drainage, congestion and dry cough.  She is afebrile and overall well-appearing.  She does have hoarse voice on exam.  No tonsillar swelling or exudates, mild posterior pharyngeal erythema with clear postnasal drainage.  Mild nasal congestion.  Chest is clear to auscultation heart regular rate and rhythm.  Rapid strep test negative.  Culture sent.  PCR COVID has obtained.  Current CDC guidelines, isolation protocol and ED precautions reviewed.  Advised patient symptoms most consistent with viral illness, COVID-19 not ruled out and definitely possible.  I have advised her to increase rest and fluids and continue with DayQuil/NyQuil and ibuprofen.  I have sent viscous lidocaine for throat pain.  Advised to follow-up as needed for any worsening symptoms or if she is not better after 10 days.  Final Clinical Impressions(s) / UC Diagnoses   Final diagnoses:  Acute upper respiratory infection  Sore throat  Cough  Voice hoarseness  Discharge Instructions      -Negative rapid strep test.  We have sent another specimen for culture and if the culture comes back positive in the next couple of days we will call and send an antibiotic for you. -Given all your other symptoms this seems most likely a viral illness and COVID-19 is not ruled out.  That result be back tomorrow.  See more information regarding COVID-19 guidelines below. -For now increase rest and fluids.  I have sent viscous lidocaine to help numb your throat.  You can take ibuprofen and continue with  DayQuil/NyQuil for your symptoms. You should feel better within 7 to 10 days if this is a viral illness.  You have received COVID testing today either for positive exposure, concerning symptoms that could be related to COVID infection, screening purposes, or re-testing after confirmed positive.  Your test obtained today checks for active viral infection in the last 1-2 weeks. If your test is negative now, you can still test positive later. So, if you do develop symptoms you should either get re-tested and/or isolate x 5 days and then strict mask use x 5 days (unvaccinated) or mask use x 10 days (vaccinated). Please follow CDC guidelines.  While Rapid antigen tests come back in 15-20 minutes, send out PCR/molecular test results typically come back within 1-3 days. In the mean time, if you are symptomatic, assume this could be a positive test and treat/monitor yourself as if you do have COVID.   We will call with test results if positive. Please download the MyChart app and set up a profile to access test results.   If symptomatic, go home and rest. Push fluids. Take Tylenol as needed for discomfort. Gargle warm salt water. Throat lozenges. Take Mucinex DM or Robitussin for cough. Humidifier in bedroom to ease coughing. Warm showers. Also review the COVID handout for more information.  COVID-19 INFECTION: The incubation period of COVID-19 is approximately 14 days after exposure, with most symptoms developing in roughly 4-5 days. Symptoms may range in severity from mild to critically severe. Roughly 80% of those infected will have mild symptoms. People of any age may become infected with COVID-19 and have the ability to transmit the virus. The most common symptoms include: fever, fatigue, cough, body aches, headaches, sore throat, nasal congestion, shortness of breath, nausea, vomiting, diarrhea, changes in smell and/or taste.    COURSE OF ILLNESS Some patients may begin with mild disease which can  progress quickly into critical symptoms. If your symptoms are worsening please call ahead to the Emergency Department and proceed there for further treatment. Recovery time appears to be roughly 1-2 weeks for mild symptoms and 3-6 weeks for severe disease.   GO IMMEDIATELY TO ER FOR FEVER YOU ARE UNABLE TO GET DOWN WITH TYLENOL, BREATHING PROBLEMS, CHEST PAIN, FATIGUE, LETHARGY, INABILITY TO EAT OR DRINK, ETC  QUARANTINE AND ISOLATION: To help decrease the spread of COVID-19 please remain isolated if you have COVID infection or are highly suspected to have COVID infection. This means -stay home and isolate to one room in the home if you live with others. Do not share a bed or bathroom with others while ill, sanitize and wipe down all countertops and keep common areas clean and disinfected. Stay home for 5 days. If you have no symptoms or your symptoms are resolving after 5 days, you can leave your house. Continue to wear a mask around others for 5 additional days. If you have been in close contact (within  6 feet) of someone diagnosed with COVID 19, you are advised to quarantine in your home for 14 days as symptoms can develop anywhere from 2-14 days after exposure to the virus. If you develop symptoms, you  must isolate.  Most current guidelines for COVID after exposure -unvaccinated: isolate 5 days and strict mask use x 5 days. Test on day 5 is possible -vaccinated: wear mask x 10 days if symptoms do not develop -You do not necessarily need to be tested for COVID if you have + exposure and  develop symptoms. Just isolate at home x10 days from symptom onset During this global pandemic, CDC advises to practice social distancing, try to stay at least 26f away from others at all times. Wear a face covering. Wash and sanitize your hands regularly and avoid going anywhere that is not necessary.  KEEP IN MIND THAT THE COVID TEST IS NOT 100% ACCURATE AND YOU SHOULD STILL DO EVERYTHING TO PREVENT POTENTIAL  SPREAD OF VIRUS TO OTHERS (WEAR MASK, WEAR GLOVES, [place]S AND SANITIZE REGULARLY). IF INITIAL TEST IS NEGATIVE, THIS MAY NOT MEAN YOU ARE DEFINITELY NEGATIVE. MOST ACCURATE TESTING IS DONE 5-7 DAYS AFTER EXPOSURE.   It is not advised by CDC to get re-tested after receiving a positive COVID test since you can still test positive for weeks to months after you have already cleared the virus.   *If you have not been vaccinated for COVID, I strongly suggest you consider getting vaccinated as long as there are no contraindications.       ED Prescriptions     Medication Sig Dispense Auth. Provider   lidocaine (XYLOCAINE) 2 % solution Use as directed 15 mLs in the mouth or throat every 3 (three) hours as needed for mouth pain (swish and spit). 100 mL EDanton Clap PA-C      PDMP not reviewed this encounter.   EDanton Clap PA-C 04/28/21 [phone]

## 2021-04-28 NOTE — Discharge Instructions (Signed)
-Negative rapid strep test.  We have sent another specimen for culture and if the culture comes back positive in the next couple of days we will call and send an antibiotic for you. -Given all your other symptoms this seems most likely a viral illness and COVID-19 is not ruled out.  That result be back tomorrow.  See more information regarding COVID-19 guidelines below. -For now increase rest and fluids.  I have sent viscous lidocaine to help numb your throat.  You can take ibuprofen and continue with DayQuil/NyQuil for your symptoms. You should feel better within 7 to 10 days if this is a viral illness.  You have received COVID testing today either for positive exposure, concerning symptoms that could be related to COVID infection, screening purposes, or re-testing after confirmed positive.  Your test obtained today checks for active viral infection in the last 1-2 weeks. If your test is negative now, you can still test positive later. So, if you do develop symptoms you should either get re-tested and/or isolate x 5 days and then strict mask use x 5 days (unvaccinated) or mask use x 10 days (vaccinated). Please follow CDC guidelines.  While Rapid antigen tests come back in 15-20 minutes, send out PCR/molecular test results typically come back within 1-3 days. In the mean time, if you are symptomatic, assume this could be a positive test and treat/monitor yourself as if you do have COVID.   We will call with test results if positive. Please download the MyChart app and set up a profile to access test results.   If symptomatic, go home and rest. Push fluids. Take Tylenol as needed for discomfort. Gargle warm salt water. Throat lozenges. Take Mucinex DM or Robitussin for cough. Humidifier in bedroom to ease coughing. Warm showers. Also review the COVID handout for more information.  COVID-19 INFECTION: The incubation period of COVID-19 is approximately 14 days after exposure, with most symptoms developing  in roughly 4-5 days. Symptoms may range in severity from mild to critically severe. Roughly 80% of those infected will have mild symptoms. People of any age may become infected with COVID-19 and have the ability to transmit the virus. The most common symptoms include: fever, fatigue, cough, body aches, headaches, sore throat, nasal congestion, shortness of breath, nausea, vomiting, diarrhea, changes in smell and/or taste.    COURSE OF ILLNESS Some patients may begin with mild disease which can progress quickly into critical symptoms. If your symptoms are worsening please call ahead to the Emergency Department and proceed there for further treatment. Recovery time appears to be roughly 1-2 weeks for mild symptoms and 3-6 weeks for severe disease.   GO IMMEDIATELY TO ER FOR FEVER YOU ARE UNABLE TO GET DOWN WITH TYLENOL, BREATHING PROBLEMS, CHEST PAIN, FATIGUE, LETHARGY, INABILITY TO EAT OR DRINK, ETC  QUARANTINE AND ISOLATION: To help decrease the spread of COVID-19 please remain isolated if you have COVID infection or are highly suspected to have COVID infection. This means -stay home and isolate to one room in the home if you live with others. Do not share a bed or bathroom with others while ill, sanitize and wipe down all countertops and keep common areas clean and disinfected. Stay home for 5 days. If you have no symptoms or your symptoms are resolving after 5 days, you can leave your house. Continue to wear a mask around others for 5 additional days. If you have been in close contact (within 6 feet) of someone diagnosed with COVID 19,  you are advised to quarantine in your home for 14 days as symptoms can develop anywhere from 2-14 days after exposure to the virus. If you develop symptoms, you  must isolate.  Most current guidelines for COVID after exposure -unvaccinated: isolate 5 days and strict mask use x 5 days. Test on day 5 is possible -vaccinated: wear mask x 10 days if symptoms do not  develop -You do not necessarily need to be tested for COVID if you have + exposure and  develop symptoms. Just isolate at home x10 days from symptom onset During this global pandemic, CDC advises to practice social distancing, try to stay at least 41f away from others at all times. Wear a face covering. Wash and sanitize your hands regularly and avoid going anywhere that is not necessary.  KEEP IN MIND THAT THE COVID TEST IS NOT 100% ACCURATE AND YOU SHOULD STILL DO EVERYTHING TO PREVENT POTENTIAL SPREAD OF VIRUS TO OTHERS (WEAR MASK, WEAR GLOVES, [place]S AND SANITIZE REGULARLY). IF INITIAL TEST IS NEGATIVE, THIS MAY NOT MEAN YOU ARE DEFINITELY NEGATIVE. MOST ACCURATE TESTING IS DONE 5-7 DAYS AFTER EXPOSURE.   It is not advised by CDC to get re-tested after receiving a positive COVID test since you can still test positive for weeks to months after you have already cleared the virus.   *If you have not been vaccinated for COVID, I strongly suggest you consider getting vaccinated as long as there are no contraindications.

## 2021-04-28 NOTE — ED Triage Notes (Signed)
Patient c/o cough, sore throat and white patches on her tonsils that started on Friday.  Patient denies fevers.  Patient did a home covid test today and was negative.

## 2021-04-29 ENCOUNTER — Ambulatory Visit: Payer: Medicaid Other

## 2021-04-29 LAB — SARS CORONAVIRUS 2 (TAT 6-24 HRS): SARS Coronavirus 2: NEGATIVE

## 2021-05-01 LAB — CULTURE, GROUP A STREP (THRC)

## 2021-07-10 ENCOUNTER — Ambulatory Visit: Payer: Medicaid Other | Admitting: Dermatology

## 2021-10-08 ENCOUNTER — Ambulatory Visit: Payer: Medicaid Other

## 2021-10-09 ENCOUNTER — Other Ambulatory Visit: Payer: Self-pay | Admitting: Orthopedic Surgery

## 2021-10-09 ENCOUNTER — Other Ambulatory Visit (HOSPITAL_COMMUNITY): Payer: Self-pay | Admitting: Orthopedic Surgery

## 2021-10-09 DIAGNOSIS — M5416 Radiculopathy, lumbar region: Secondary | ICD-10-CM

## 2021-10-27 ENCOUNTER — Ambulatory Visit
Admission: RE | Admit: 2021-10-27 | Discharge: 2021-10-27 | Disposition: A | Discharge status: Home / Self Care | Payer: Medicaid Other | Source: Ambulatory Visit | Attending: Orthopedic Surgery | Admitting: Orthopedic Surgery

## 2021-10-27 ENCOUNTER — Other Ambulatory Visit: Payer: Self-pay

## 2021-10-27 DIAGNOSIS — M5416 Radiculopathy, lumbar region: Secondary | ICD-10-CM | POA: Diagnosis not present

## 2022-03-13 ENCOUNTER — Encounter: Payer: Self-pay | Admitting: Family Medicine

## 2022-03-13 ENCOUNTER — Ambulatory Visit (INDEPENDENT_AMBULATORY_CARE_PROVIDER_SITE_OTHER): Payer: Medicaid Other | Admitting: Family Medicine

## 2022-03-13 VITALS — BP 92/65 | HR 66 | Temp 98.0°F | Resp 16 | Wt 185.0 lb

## 2022-03-13 DIAGNOSIS — Z114 Encounter for screening for human immunodeficiency virus [HIV]: Secondary | ICD-10-CM | POA: Insufficient documentation

## 2022-03-13 DIAGNOSIS — G8929 Other chronic pain: Secondary | ICD-10-CM

## 2022-03-13 DIAGNOSIS — M5442 Lumbago with sciatica, left side: Secondary | ICD-10-CM

## 2022-03-13 DIAGNOSIS — Z1159 Encounter for screening for other viral diseases: Secondary | ICD-10-CM | POA: Diagnosis not present

## 2022-03-13 DIAGNOSIS — Z91030 Bee allergy status: Secondary | ICD-10-CM | POA: Insufficient documentation

## 2022-03-13 DIAGNOSIS — M5441 Lumbago with sciatica, right side: Secondary | ICD-10-CM

## 2022-03-13 DIAGNOSIS — F32A Depression, unspecified: Secondary | ICD-10-CM

## 2022-03-13 DIAGNOSIS — R5383 Other fatigue: Secondary | ICD-10-CM

## 2022-03-13 MED ORDER — EPINEPHRINE 0.3 MG/0.3ML IJ SOAJ: 0.3000 mg | INTRAMUSCULAR | 1 refills | Status: AC | PRN

## 2022-03-13 NOTE — Assessment & Plan Note (Signed)
Low risk screen ?Consented; encouraged to "know your status" ?Recommend repeat screen if risk factors change ? ?

## 2022-03-13 NOTE — Assessment & Plan Note (Signed)
Reports depression is stable; continues to endorse fatigue Request for labs today Concern for POTS  Encourage contact with neurology and cardiology to ensure they will cover this diagnosis

## 2022-03-13 NOTE — Assessment & Plan Note (Signed)
Low risk screen Treatable, and curable. If left untreated Hep C can lead to cirrhosis and liver failure. Encourage routine testing; recommend repeat testing if risk factors change.  

## 2022-03-13 NOTE — Assessment & Plan Note (Signed)
Chronic, request for refill of EPI PEN d/t previous expiration

## 2022-03-13 NOTE — Assessment & Plan Note (Signed)
Chronic, stable Has limitations for p/t work at [employer] Seen by specialist Gets injections

## 2022-03-13 NOTE — Progress Notes (Signed)
I,Sulibeya S Dimas,acting as a Education administrator for Dominique Sprout, FNP.,have documented all relevant documentation on the behalf of Dominique Sprout, FNP,as directed by  Dominique Sprout, FNP while in the presence of Dominique Sprout, FNP.   Established patient visit   Patient: Dominique Navarro   DOB: [birth year]   31 y.o. Female  MRN: [id number] Visit Date: 03/13/2022  Today's healthcare provider: [name], FNP  Introduced to nurse practitioner role and practice setting.  All questions answered.  Discussed provider/patient relationship and expectations.   Chief Complaint  Patient presents with   Fatigue   Subjective    HPI  Patient requesting labs to see if something is causing her to be tired all the time. Patient reports lightheadedness, nausea and no energy. Patient reports symptoms have been present for a few months.    Medications: Outpatient Medications Prior to Visit  Medication Sig   Adapalene-Benzoyl Peroxide (EPIDUO FORTE) 0.3-2.5 % GEL Apply 1 application topically at bedtime. Pea size amount to face nightly for acne   albuterol (VENTOLIN HFA) 108 (90 Base) MCG/ACT inhaler Inhale 2 puffs into the lungs every 4 (four) hours as needed for wheezing or shortness of breath.   azelastine (ASTELIN) 0.1 % nasal spray SMARTSIG:1 Spray(s) Both Nares Twice Daily PRN   DULoxetine (CYMBALTA) 20 MG capsule Take 2 capsules (40 mg total) by mouth daily.   fluticasone (FLONASE) 50 MCG/ACT nasal spray Place into both nostrils.   gabapentin (NEURONTIN) 300 MG capsule Take 300 mg by mouth at bedtime.   meloxicam (MOBIC) 15 MG tablet Take 1 tablet (15 mg total) by mouth daily as needed for pain.   metoprolol succinate (TOPROL-XL) 50 MG 24 hr tablet Take 50 mg by mouth daily.   montelukast (SINGULAIR) 10 MG tablet TAKE 1 TABLET BY MOUTH EVERYDAY AT BEDTIME   Multiple Vitamin (MULTIVITAMIN WITH MINERALS) TABS tablet Take 1 tablet by mouth daily.   nortriptyline (PAMELOR) 50 MG capsule Take 50 mg by mouth at  bedtime.   rizatriptan (MAXALT) 10 MG tablet Take 10 mg by mouth 2 (two) times daily as needed.   [DISCONTINUED] EPINEPHrine 0.3 mg/0.3 mL IJ SOAJ injection Inject 0.3 mLs (0.3 mg total) into the muscle as needed for anaphylaxis.   [DISCONTINUED] busPIRone (BUSPAR) 7.5 MG tablet Take 1 tablet (7.5 mg total) by mouth 2 (two) times daily.   [DISCONTINUED] cyclobenzaprine (FLEXERIL) 10 MG tablet Take 1 tablet (10 mg total) by mouth at bedtime.   [DISCONTINUED] ibuprofen (ADVIL) 600 MG tablet Take 1 tablet (600 mg total) by mouth every 6 (six) hours as needed.   [DISCONTINUED] ipratropium (ATROVENT) 0.06 % nasal spray Place 2 sprays into both nostrils 4 (four) times daily.   [DISCONTINUED] lidocaine (XYLOCAINE) 2 % solution Use as directed 15 mLs in the mouth or throat every 3 (three) hours as needed for mouth pain (swish and spit).   [DISCONTINUED] mupirocin ointment (BACTROBAN) 2 % Apply 1 application topically 2 (two) times daily.   No facility-administered medications prior to visit.    Review of Systems  Constitutional:  Positive for activity change and fatigue. Negative for chills, fever and unexpected weight change.  Eyes:  Negative for visual disturbance.  Respiratory:  Positive for chest tightness and shortness of breath. Negative for cough and wheezing.   Cardiovascular:  Positive for palpitations. Negative for chest pain and leg swelling.  Gastrointestinal:  Positive for nausea. Negative for abdominal pain, blood in stool, constipation, diarrhea and vomiting.  Genitourinary:  Negative for hematuria.  Musculoskeletal:  Positive for myalgias.  Skin:  Negative for rash.  Neurological:  Positive for dizziness, weakness, light-headedness and numbness.        Objective    BP 92/65 (BP Location: Right Arm, Patient Position: Sitting, Cuff Size: Large)   Pulse 66   Temp 98 F (36.7 C) (Oral)   Resp 16   Wt 185 lb (83.9 kg)   BMI 31.76 kg/m  BP Readings from Last 3 Encounters:   03/13/22 92/65  04/28/21 (!) 126/91  03/29/21 100/69   Wt Readings from Last 3 Encounters:  03/13/22 185 lb (83.9 kg)  04/28/21 170 lb (77.1 kg)  03/29/21 170 lb (77.1 kg)      Physical Exam Vitals and nursing note reviewed.  Constitutional:      General: She is not in acute distress.    Appearance: Normal appearance. She is obese. She is not ill-appearing, toxic-appearing or diaphoretic.  HENT:     Head: Normocephalic and atraumatic.  Cardiovascular:     Rate and Rhythm: Normal rate and regular rhythm.     Pulses: Normal pulses.     Heart sounds: Normal heart sounds. No murmur heard.    No friction rub. No gallop.  Pulmonary:     Effort: Pulmonary effort is normal. No respiratory distress.     Breath sounds: Normal breath sounds. No stridor. No wheezing, rhonchi or rales.  Chest:     Chest wall: No tenderness.  Abdominal:     General: Bowel sounds are normal.     Palpations: Abdomen is soft.  Musculoskeletal:        General: No swelling, tenderness, deformity or signs of injury. Normal range of motion.     Cervical back: Normal range of motion and neck supple. No tenderness.     Right lower leg: No edema.     Left lower leg: No edema.  Skin:    General: Skin is warm and dry.     Capillary Refill: Capillary refill takes less than 2 seconds.     Coloration: Skin is not jaundiced or pale.     Findings: No bruising, erythema, lesion or rash.  Neurological:     General: No focal deficit present.     Mental Status: She is alert and oriented to person, place, and time. Mental status is at baseline.     Cranial Nerves: No cranial nerve deficit.     Sensory: No sensory deficit.     Motor: No weakness.     Coordination: Coordination normal.  Psychiatric:        Mood and Affect: Mood normal.        Behavior: Behavior normal.        Thought Content: Thought content normal.        Judgment: Judgment normal.      No results found for any visits on 03/13/22.  Assessment &  Plan     Problem List Items Addressed This Visit       Nervous and Auditory   Chronic bilateral low back pain with bilateral sciatica - Primary    Chronic, stable Has limitations for p/t work at [employer] Seen by specialist Gets injections       Relevant Medications   gabapentin (NEURONTIN) 300 MG capsule     Other   Bee allergy status    Chronic, request for refill of EPI PEN d/t previous expiration       Relevant Medications   EPINEPHrine 0.3 mg/0.3  mL IJ SOAJ injection   Encounter for hepatitis C screening test for low risk patient    Low risk screen Treatable, and curable. If left untreated Hep C can lead to cirrhosis and liver failure. Encourage routine testing; recommend repeat testing if risk factors change.       Relevant Orders   Hepatitis C Antibody   Encounter for screening for HIV    Low risk screen Consented; encouraged to "know your status" Recommend repeat screen if risk factors change       Relevant Orders   HIV antibody (with reflex)   Fatigue due to depression    Reports depression is stable; continues to endorse fatigue Request for labs today Concern for POTS  Encourage contact with neurology and cardiology to ensure they will cover this diagnosis       Relevant Orders   B12 and Folate Panel   Vitamin B6   Vitamin D (25 hydroxy)   Comprehensive Metabolic Panel (CMET)   CBC with Differential/Platelet   TSH + free T4     Return if symptoms worsen or fail to improve.      Vonna Kotyk, FNP, have reviewed all documentation for this visit. The documentation on 03/13/22 for the exam, diagnosis, procedures, and orders are all accurate and complete.    Dominique Navarro, [place] [phone] (phone) [phone] (fax)  [place]

## 2022-03-14 NOTE — Progress Notes (Signed)
B-6 pending; borderline low Vit D- recommend 2000 IU available OTC to assist.   All other labs normal and stable.  [name], [address] [place],  [address] [phone] (phone) [phone] (fax) [place]

## 2022-03-22 LAB — CBC WITH DIFFERENTIAL/PLATELET
Basophils Absolute: 0 10*3/uL (ref 0.0–0.2)
Basos: 1 %
EOS (ABSOLUTE): 0.2 10*3/uL (ref 0.0–0.4)
Eos: 4 %
Hematocrit: 36.3 % (ref 34.0–46.6)
Hemoglobin: 12.8 g/dL (ref 11.1–15.9)
Immature Grans (Abs): 0 10*3/uL (ref 0.0–0.1)
Immature Granulocytes: 0 %
Lymphocytes Absolute: 1.8 10*3/uL (ref 0.7–3.1)
Lymphs: 32 %
MCH: 28.8 pg (ref 26.6–33.0)
MCHC: 35.3 g/dL (ref 31.5–35.7)
MCV: 82 fL (ref 79–97)
Monocytes Absolute: 0.4 10*3/uL (ref 0.1–0.9)
Monocytes: 8 %
Neutrophils Absolute: 3.2 10*3/uL (ref 1.4–7.0)
Neutrophils: 55 %
Platelets: 259 10*3/uL (ref 150–450)
RBC: 4.44 x10E6/uL (ref 3.77–5.28)
RDW: 12.4 % (ref 11.7–15.4)
WBC: 5.6 10*3/uL (ref 3.4–10.8)

## 2022-03-22 LAB — VITAMIN B6: Vitamin B6: 18.3 ug/L (ref 3.4–65.2)

## 2022-03-22 LAB — COMPREHENSIVE METABOLIC PANEL
ALT: 12 IU/L (ref 0–32)
AST: 18 IU/L (ref 0–40)
Albumin/Globulin Ratio: 1.7 (ref 1.2–2.2)
Albumin: 4 g/dL (ref 3.9–4.9)
Alkaline Phosphatase: 72 IU/L (ref 44–121)
BUN/Creatinine Ratio: 11 (ref 9–23)
BUN: 10 mg/dL (ref 6–20)
Bilirubin Total: 0.2 mg/dL (ref 0.0–1.2)
CO2: 24 mmol/L (ref 20–29)
Calcium: 9.7 mg/dL (ref 8.7–10.2)
Chloride: 104 mmol/L (ref 96–106)
Creatinine, Ser: 0.95 mg/dL (ref 0.57–1.00)
Globulin, Total: 2.3 g/dL (ref 1.5–4.5)
Glucose: 94 mg/dL (ref 70–99)
Potassium: 4.2 mmol/L (ref 3.5–5.2)
Sodium: 141 mmol/L (ref 134–144)
Total Protein: 6.3 g/dL (ref 6.0–8.5)
eGFR: 82 mL/min/{1.73_m2} (ref >59)

## 2022-03-22 LAB — VITAMIN D 25 HYDROXY (VIT D DEFICIENCY, FRACTURES): Vit D, 25-Hydroxy: 30.5 ng/mL (ref 30.0–100.0)

## 2022-03-22 LAB — TSH+FREE T4
Free T4: 1.05 ng/dL (ref 0.82–1.77)
TSH: 1.18 u[IU]/mL (ref 0.450–4.500)

## 2022-03-22 LAB — HIV ANTIBODY (ROUTINE TESTING W REFLEX): HIV Screen 4th Generation wRfx: NONREACTIVE

## 2022-03-22 LAB — HEPATITIS C ANTIBODY: Hep C Virus Ab: NONREACTIVE

## 2022-03-22 LAB — B12 AND FOLATE PANEL
Folate: >20 ng/mL (ref >3.0)
Vitamin B-12: 456 pg/mL (ref 232–1245)

## 2022-03-26 ENCOUNTER — Encounter: Payer: Self-pay | Admitting: Emergency Medicine

## 2022-03-26 ENCOUNTER — Ambulatory Visit
Admission: EM | Admit: 2022-03-26 | Discharge: 2022-03-26 | Disposition: A | Discharge status: Home / Self Care | Payer: Medicaid Other | Attending: Emergency Medicine | Admitting: Emergency Medicine

## 2022-03-26 DIAGNOSIS — R0602 Shortness of breath: Secondary | ICD-10-CM | POA: Diagnosis not present

## 2022-03-26 DIAGNOSIS — T50905A Adverse effect of unspecified drugs, medicaments and biological substances, initial encounter: Secondary | ICD-10-CM

## 2022-03-26 DIAGNOSIS — R202 Paresthesia of skin: Secondary | ICD-10-CM

## 2022-03-26 MED ORDER — AEROCHAMBER PLUS FLO-VU MEDIUM MISC
1.0000 | Freq: Once | Status: AC
  Administered 2022-03-26: 1

## 2022-03-26 MED ORDER — ALBUTEROL SULFATE HFA 108 (90 BASE) MCG/ACT IN AERS
2.0000 | INHALATION_SPRAY | Freq: Once | RESPIRATORY_TRACT | Status: AC
  Administered 2022-03-26: 2 via RESPIRATORY_TRACT

## 2022-03-26 NOTE — ED Triage Notes (Signed)
Pt here post back steroid back injections 2 days ago. Afterward, she began to feel facial and chest flushing, SOB, and tingling in legs. No loss of bowel or bladder control. No LOC. No vision changes. No initial neuro deficits noted. Pt having conversation with her visitor and speaking in full sentences without noticeable breathlessness. No facial swelling or alteration to airway.

## 2022-03-26 NOTE — ED Provider Notes (Signed)
Roderic Palau    CSN: [id number] Arrival date & time: 03/26/22  1106      History   Chief Complaint Chief Complaint  Patient presents with   Medication Reaction    HPI Dominique Navarro is a 31 y.o. female. Pt here post back steroid back injections 2 days ago. Afterward, she began to feel "strange." Yesterday she had facial and chest flushing that has since resolved. Today she describes a feeling of having trouble taking a deep bfreath and tingling in both legs, the entire length of leg. No loss of bowel or bladder control. No LOC. No vision changes. Has used an inhaler in the past when sick with bronchitis but does not have one now to use. She called her doctor and they sent her here.   HPI  Past Medical History:  Diagnosis Date   Depression    Environmental allergies    Migraine     Patient Active Problem List   Diagnosis Date Noted   Encounter for screening for HIV 03/13/2022   Encounter for hepatitis C screening test for low risk patient 03/13/2022   Fatigue due to depression 03/13/2022   Chronic bilateral low back pain with bilateral sciatica 03/13/2022   Bee allergy status 03/13/2022   Intractable migraine with aura without status migrainosus 09/16/2019   Arthralgia 06/06/2019   Osteoporosis screening 06/06/2019   Other insomnia 06/06/2019   Migraine headache 05/14/2018   Advance directive discussed with patient 09/02/2013   Depression complicating pregnancy, antepartum 04/29/2013   FHx: epilepsy 04/29/2013   Medication exposure during first trimester of pregnancy 04/29/2013    Past Surgical History:  Procedure Laterality Date   CYST REMOVAL NECK      OB History     Gravida  3   Para  2   Term  2   Preterm      AB  1   Living  2      SAB  1   IAB      Ectopic      Multiple      Live Births  2            Home Medications    Prior to Admission medications   Medication Sig Start Date End Date Taking? Authorizing Provider   Adapalene-Benzoyl Peroxide (EPIDUO FORTE) 0.3-2.5 % GEL Apply 1 application topically at bedtime. Pea size amount to face nightly for acne 03/28/21   Ralene Bathe, MD  albuterol (VENTOLIN HFA) 108 (90 Base) MCG/ACT inhaler Inhale 2 puffs into the lungs every 4 (four) hours as needed for wheezing or shortness of breath. 03/23/20   Sharion Balloon, NP  azelastine (ASTELIN) 0.1 % nasal spray SMARTSIG:1 Spray(s) Both Nares Twice Daily PRN 04/24/20   [provider]  DULoxetine (CYMBALTA) 20 MG capsule Take 2 capsules (40 mg total) by mouth daily. 01/30/21   Birdie Sons, MD  EPINEPHrine 0.3 mg/0.3 mL IJ SOAJ injection Inject 0.3 mg into the muscle as needed for anaphylaxis. 03/13/22   [name], FNP  fluticasone (FLONASE) 50 MCG/ACT nasal spray Place into both nostrils. 05/28/20   [provider]  gabapentin (NEURONTIN) 300 MG capsule Take 300 mg by mouth at bedtime. 02/13/22   [provider]  meloxicam (MOBIC) 15 MG tablet Take 1 tablet (15 mg total) by mouth daily as needed for pain. 02/05/21   Birdie Sons, MD  metoprolol succinate (TOPROL-XL) 50 MG 24 hr tablet Take 50 mg by mouth daily.  12/12/20   [provider]  montelukast (SINGULAIR) 10 MG tablet TAKE 1 TABLET BY MOUTH EVERYDAY AT BEDTIME 01/30/21   Birdie Sons, MD  Multiple Vitamin (MULTIVITAMIN WITH MINERALS) TABS tablet Take 1 tablet by mouth daily.    [provider]  nortriptyline (PAMELOR) 50 MG capsule Take 50 mg by mouth at bedtime. 03/11/20   [provider]  rizatriptan (MAXALT) 10 MG tablet Take 10 mg by mouth 2 (two) times daily as needed. 05/13/20   [provider]  amitriptyline (ELAVIL) 25 MG tablet Take 1 tablet (25 mg total) by mouth at bedtime. 08/31/19 01/07/20  Trinna Post, PA-C  promethazine (PHENERGAN) 12.5 MG tablet Take 1 tablet (12.5 mg total) by mouth every 8 (eight) hours as needed for nausea or vomiting. 08/31/19 12/15/20  Trinna Post, PA-C     Family History Family History  Problem Relation Age of Onset   Asthma Mother    COPD Mother    COPD Father    Seizures Son     Social History Social History   Tobacco Use   Smoking status: Former    Types: Cigarettes    Quit date: 02/23/2019    Years since quitting: 3.0   Smokeless tobacco: Never  Vaping Use   Vaping Use: Never used  Substance Use Topics   Alcohol use: Never   Drug use: Never     Allergies   Elemental sulfur, Bee venom, Sulfa antibiotics, and Topiramate   Review of Systems Review of Systems   Physical Exam Triage Vital Signs ED Triage Vitals  Enc Vitals Group     BP 03/26/22 1117 103/69     Pulse Rate 03/26/22 1117 67     Resp 03/26/22 1117 18     Temp 03/26/22 1117 98.1 F (36.7 C)     Temp src --      SpO2 03/26/22 1117 99 %     Weight --      Height --      Head Circumference --      Peak Flow --      Pain Score 03/26/22 1119 0     Pain Loc --      Pain Edu? --      Excl. in [place]? --    No data found.  Updated Vital Signs BP 103/69   Pulse 67   Temp 98.1 F (36.7 C)   Resp 18   SpO2 99%   Visual Acuity Right Eye Distance:   Left Eye Distance:   Bilateral Distance:    Right Eye Near:   Left Eye Near:    Bilateral Near:     Physical Exam Constitutional:      Appearance: Normal appearance.  Cardiovascular:     Rate and Rhythm: Normal rate and regular rhythm.  Pulmonary:     Effort: Pulmonary effort is normal.     Breath sounds: Normal breath sounds.  Skin:    General: Skin is warm and dry.     Findings: No rash.     Comments: I did not remove bandaids from B low back but the skin around them is not erythematous, warm, or swollen.   Neurological:     Mental Status: She is alert and oriented to person, place, and time.     Sensory: No sensory deficit.     Motor: No weakness.     Gait: Gait normal.      UC Treatments / Results  Labs (all  labs ordered are listed, but only abnormal results are  displayed) Labs Reviewed - No data to display  EKG   Radiology No results found.  Procedures Procedures (including critical care time)  Medications Ordered in UC Medications  albuterol (VENTOLIN HFA) 108 (90 Base) MCG/ACT inhaler 2 puff (2 puffs Inhalation Given 03/26/22 1131)  AeroChamber Plus Flo-Vu Medium MISC 1 each (1 each Other Given 03/26/22 1131)    Initial Impression / Assessment and Plan / UC Course  I have reviewed the triage vital signs and the nursing notes.  Pertinent labs & imaging results that were available during my care of the patient were reviewed by me and considered in my medical decision making (see chart for details).    I cannot be sure but I suspect some of the dexamethasone and lidocaine used in her injections entered systemic bloodstream. O2 sats ok, lungs are CTA, use of albuterol did not alter feeling of not being able to take a deep breath. Reviewed reasaons for seeking ER care. Pt to f/u with back doctor in 1.5 weeks.   Final Clinical Impressions(s) / UC Diagnoses   Final diagnoses:  Adverse effect of drug, initial encounter     Discharge Instructions      Use the albuterol 2 puffs every 4-6 hours if needed for shortness of breath or wheezing.  If you feel short of breath and like you are having trouble breathing and using the inhaler does not help, please go to the ER.  Let your doctor know at your follow-up appointment in 2 weeks about the symptoms you have been experiencing.  I suspect a tiny bit of the medications from the injection got into your bloodstream so you are feeling so strange.   ED Prescriptions   None    PDMP not reviewed this encounter.   Carvel Getting, NP 03/26/22 1245

## 2022-03-26 NOTE — Discharge Instructions (Signed)
Use the albuterol 2 puffs every 4-6 hours if needed for shortness of breath or wheezing.  If you feel short of breath and like you are having trouble breathing and using the inhaler does not help, please go to the ER.  Let your doctor know at your follow-up appointment in 2 weeks about the symptoms you have been experiencing.  I suspect a tiny bit of the medications from the injection got into your bloodstream so you are feeling so strange.

## 2022-04-08 ENCOUNTER — Other Ambulatory Visit: Payer: Self-pay | Admitting: Family Medicine

## 2022-04-08 DIAGNOSIS — G5603 Carpal tunnel syndrome, bilateral upper limbs: Secondary | ICD-10-CM

## 2022-04-08 DIAGNOSIS — Z9109 Other allergy status, other than to drugs and biological substances: Secondary | ICD-10-CM

## 2022-04-08 DIAGNOSIS — O99340 Other mental disorders complicating pregnancy, unspecified trimester: Secondary | ICD-10-CM

## 2022-04-08 NOTE — Telephone Encounter (Signed)
rx was dc'd on 03/13/22 by provider.   Requested Prescriptions  Pending Prescriptions Disp Refills  . meloxicam (MOBIC) 15 MG tablet [Pharmacy Med Name: MELOXICAM 15 MG TABLET] 90 tablet 2    Sig: TAKE 1 TABLET BY MOUTH EVERY DAY AS NEEDED FOR PAIN     Analgesics:  COX2 Inhibitors Failed - 04/08/2022 10:54 AM      Failed - Manual Review: Labs are only required if the patient has taken medication for more than 8 weeks.      Passed - HGB in normal range and within 360 days    Hemoglobin  Date Value Ref Range Status  03/13/2022 12.8 11.1 - 15.9 g/dL Final         Passed - Cr in normal range and within 360 days    Creatinine, Ser  Date Value Ref Range Status  03/13/2022 0.95 0.57 - 1.00 mg/dL Final         Passed - HCT in normal range and within 360 days    Hematocrit  Date Value Ref Range Status  03/13/2022 36.3 34.0 - 46.6 % Final         Passed - AST in normal range and within 360 days    AST  Date Value Ref Range Status  03/13/2022 18 0 - 40 IU/L Final         Passed - ALT in normal range and within 360 days    ALT  Date Value Ref Range Status  03/13/2022 12 0 - 32 IU/L Final         Passed - eGFR is 30 or above and within 360 days    GFR calc Af Amer  Date Value Ref Range Status  05/19/2019 95 >59 mL/min/1.73 Final   GFR, Estimated  Date Value Ref Range Status  11/15/2020 >60 >60 mL/min Final    Comment:    (NOTE) Calculated using the CKD-EPI Creatinine Equation (2021)    eGFR  Date Value Ref Range Status  03/13/2022 82 >59 mL/min/1.73 Final         Passed - Patient is not pregnant      Passed - Valid encounter within last 12 months    Recent Outpatient Visits          3 weeks ago Chronic bilateral low back pain with bilateral sciatica   [facility] [name], FNP   1 year ago Complication of left ear piercing, initial encounter   [facility] Trinna Post, PA-C   1 year ago Depression complicating pregnancy,  antepartum   [place], [place], [place]   1 year ago Nexplanon in place   [place], [place], [place]   2 years ago Rapid palpitations   [place], Anderson Malta M, PA-C             . montelukast (SINGULAIR) 10 MG tablet [Pharmacy Med Name: MONTELUKAST SOD 10 MG TABLET] 90 tablet 3    Sig: TAKE 1 TABLET BY MOUTH EVERYDAY AT BEDTIME     Pulmonology:  Leukotriene Inhibitors Passed - 04/08/2022 10:54 AM      Passed - Valid encounter within last 12 months    Recent Outpatient Visits          3 weeks ago Chronic bilateral low back pain with bilateral sciatica   [facility] [name], FNP   1 year ago Complication of left ear piercing, initial encounter   [facility] Trinna Post, PA-C   1 year ago Depression complicating pregnancy, antepartum   [place], [place], [place]   1 year ago Nexplanon in place   [place], [place], [place]   2 years ago Rapid palpitations   [facility] Fenton Malling M, [place]             . DULoxetine (CYMBALTA) 20 MG capsule [Pharmacy Med Name: DULOXETINE HCL DR 20 MG CAP] 180 capsule 3    Sig: TAKE 2 CAPSULES BY MOUTH EVERY DAY     Psychiatry: Antidepressants - SNRI - duloxetine Passed - 04/08/2022 10:54 AM      Passed - Cr in normal range and within 360 days    Creatinine, Ser  Date Value Ref Range Status  03/13/2022 0.95 0.57 - 1.00 mg/dL Final         Passed - eGFR is 30 or above and within 360 days    GFR calc Af Amer  Date Value Ref Range Status  05/19/2019 95 >59 mL/min/1.73 Final   GFR, Estimated  Date Value Ref Range Status  11/15/2020 >60 >60 mL/min Final    Comment:    (NOTE) Calculated using the CKD-EPI Creatinine Equation (2021)    eGFR  Date Value Ref Range Status  03/13/2022 82 >59 mL/min/1.73 Final         Passed - Completed PHQ-2 or PHQ-9 in the  last 360 days      Passed - Last BP in normal range    BP Readings from Last 1 Encounters:  03/26/22 103/69         Passed - Valid encounter within last 6 months    Recent Outpatient Visits          3 weeks ago Chronic bilateral low back pain with bilateral sciatica   [facility] [name], FNP   1 year ago Complication of left ear piercing, initial encounter   [facility] [place], Wendee Beavers, PA-C   1 year ago Depression complicating pregnancy, antepartum   [place], [place], [place]   1 year ago Nexplanon in place   [place], [place], [place]   2 years ago Rapid palpitations   [place], Anderson Malta M, [place]             . busPIRone (BUSPAR) 7.5 MG tablet [Pharmacy Med Name: BUSPIRONE HCL 7.5 MG TABLET] 60 tablet 11    Sig: TAKE 1 TABLET BY MOUTH 2 TIMES DAILY.     Psychiatry: Anxiolytics/Hypnotics - Non-controlled Passed - 04/08/2022 10:54 AM      Passed - Valid encounter within last 12 months    Recent Outpatient Visits          3 weeks ago Chronic bilateral low back pain with bilateral sciatica   [facility] [name], FNP   1 year ago Complication of left ear piercing, initial encounter   [facility] Trinna Post, PA-C   1 year ago Depression complicating pregnancy, antepartum   [place], Wendee Beavers, [place]   1 year ago Nexplanon in place   [place], [place], [place]   2 years ago Rapid palpitations   [facility] Mar Daring, [place]

## 2022-04-25 ENCOUNTER — Ambulatory Visit: Payer: Medicaid Other | Admitting: Family Medicine

## 2022-04-25 ENCOUNTER — Encounter: Payer: Self-pay | Admitting: Family Medicine

## 2022-04-25 DIAGNOSIS — G43809 Other migraine, not intractable, without status migrainosus: Secondary | ICD-10-CM

## 2022-04-25 MED ORDER — RIZATRIPTAN BENZOATE 10 MG PO TABS: 10.0000 mg | ORAL_TABLET | Freq: Two times a day (BID) | ORAL | 0 refills | Status: AC | PRN

## 2022-04-25 MED ORDER — METHOCARBAMOL 750 MG PO TABS: 750.0000 mg | ORAL_TABLET | Freq: Three times a day (TID) | ORAL | 1 refills | Status: AC | PRN

## 2022-04-25 MED ORDER — TRAMADOL HCL 50 MG PO TABS: 50.0000 mg | ORAL_TABLET | Freq: Four times a day (QID) | ORAL | 0 refills | Status: AC | PRN
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
## 2022-04-25 NOTE — Assessment & Plan Note (Signed)
Acute on chronic, worsening Has appt next wk with neurology Request for refills of triptans  Denies LOC; however, concern for post concussive syndrome

## 2022-04-29 IMAGING — CR DG CHEST 2V
1 series · 2 of 2 positions shown · non-contrast
Comparison: None.

CLINICAL DATA: Chest pain, shortness of breath

EXAM:
CHEST - 2 VIEW

[Series 1: dg chest 2 view · 0.14mm/px · 2 of 2 slices shown]
[im 1/2]
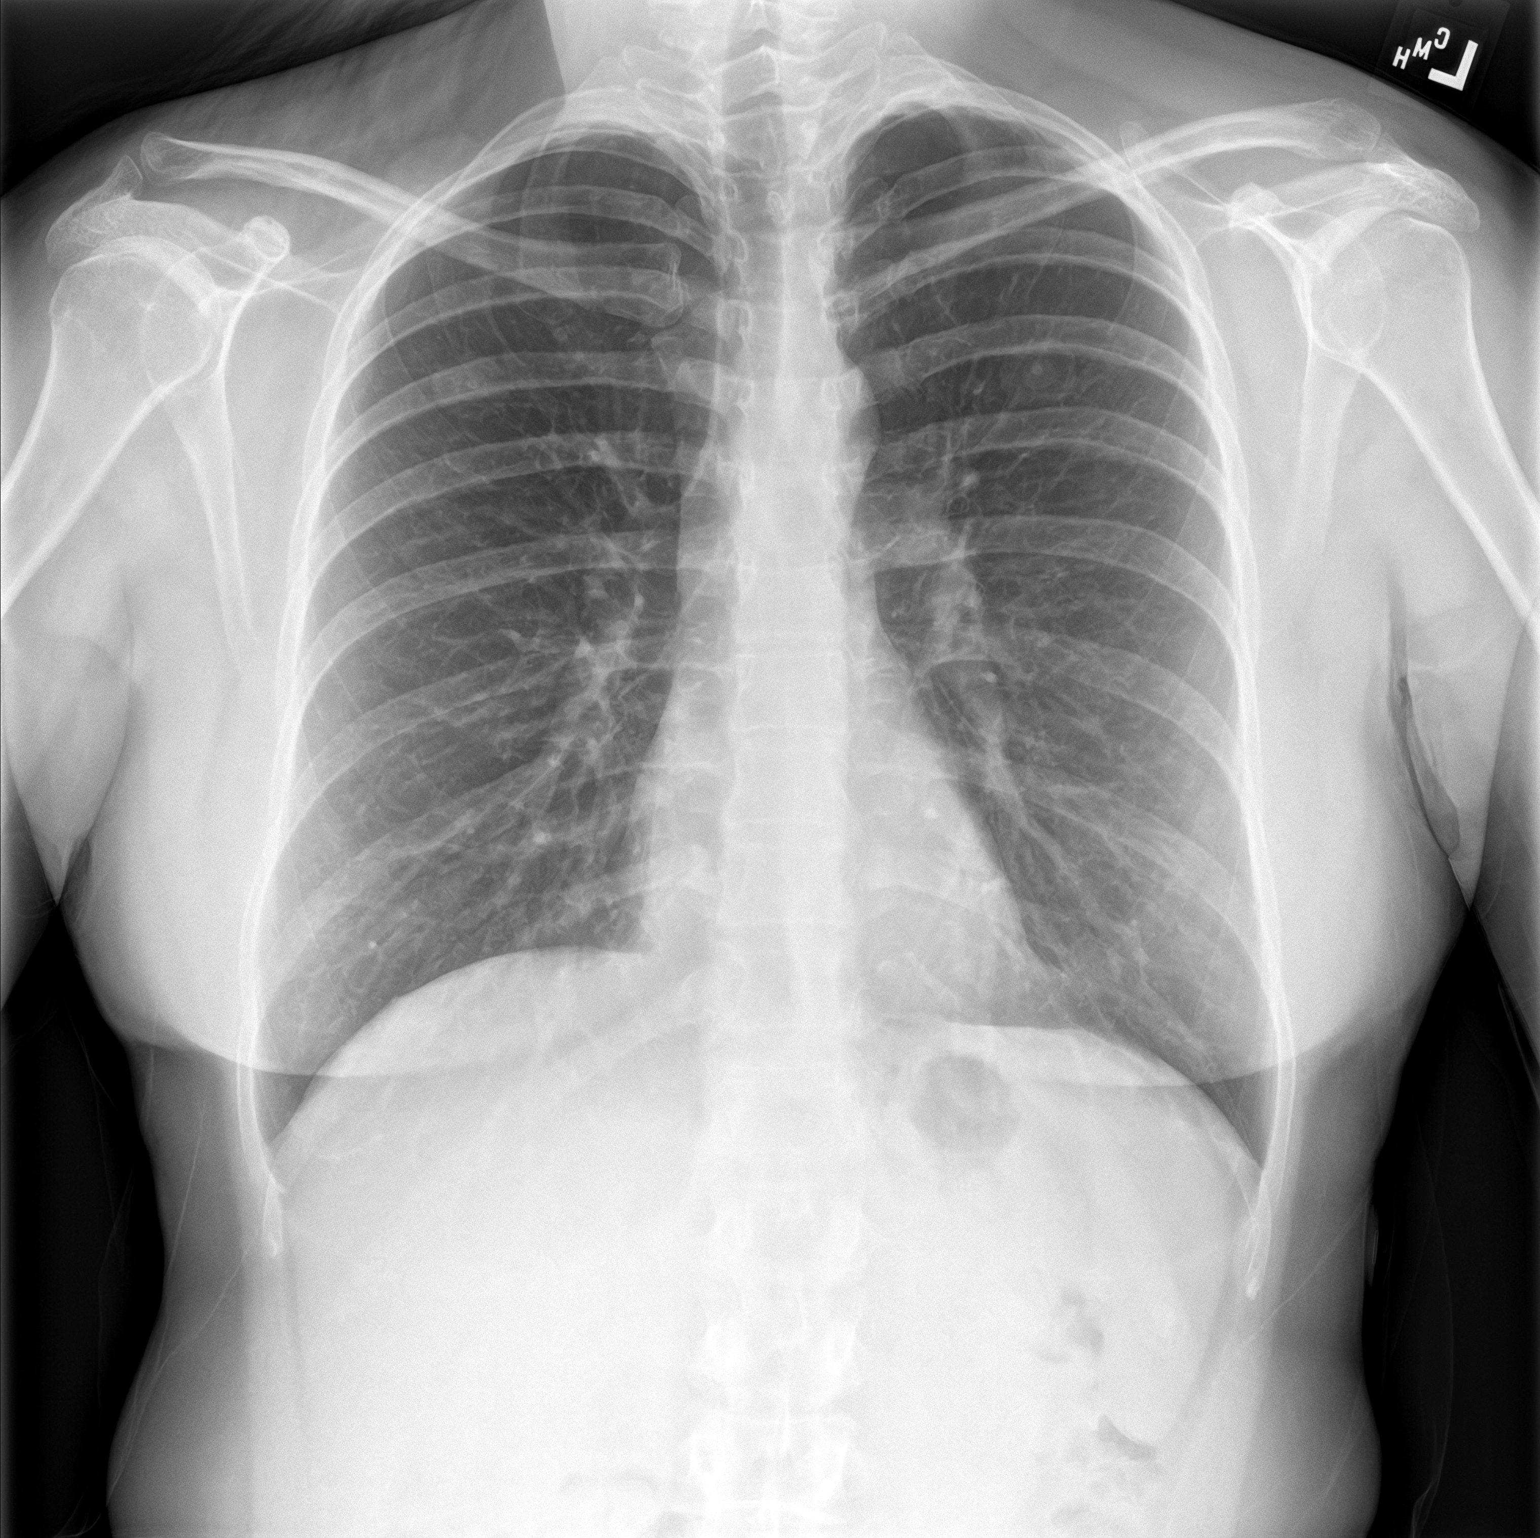
[im 2/2]
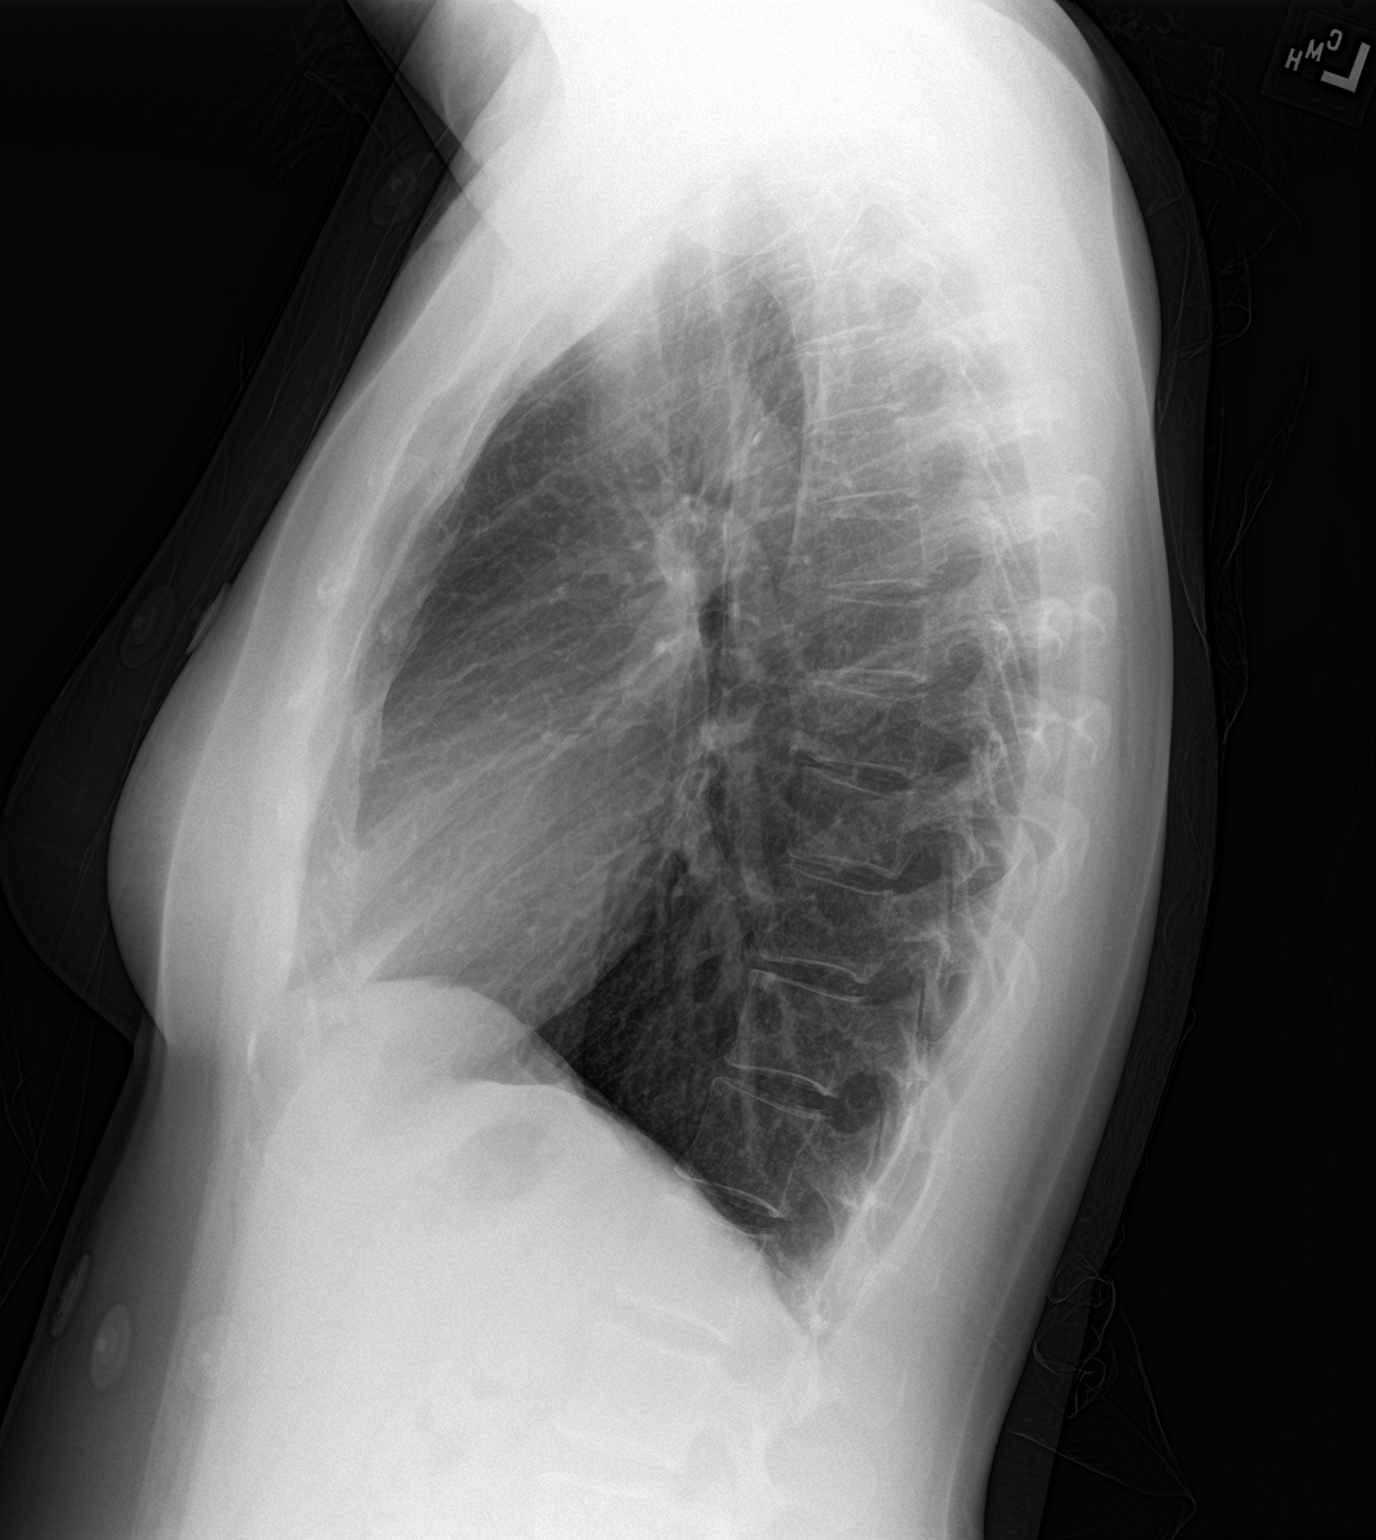

[2 of 2 positions shown; findings below may reference images not displayed]

FINDINGS: The heart size and mediastinal contours are within normal limits.
Both lungs are clear. The visualized skeletal structures are
unremarkable.
IMPRESSION: Normal chest x-rays.

## 2022-05-01 ENCOUNTER — Encounter: Payer: Self-pay | Admitting: Family Medicine

## 2022-05-07 ENCOUNTER — Telehealth: Payer: Self-pay | Admitting: Family Medicine

## 2022-05-07 NOTE — Telephone Encounter (Signed)
Copied from [place] [id number]. Topic: General - Other >> May 07, 2022  2:34 PM Ja-Kwan M wrote: Reason for CRM: Pt requests that the medical notes from her visit be faxed to [facility] at fax# [phone] Claim# [id number]

## 2022-09-10 IMAGING — CR DG WRIST COMPLETE 3+V*R*
4 series · 4 of 4 positions shown · non-contrast
Comparison: None.

CLINICAL DATA: Right wrist pain

EXAM:
RIGHT WRIST - COMPLETE 3+ VIEW

[wrist pa]
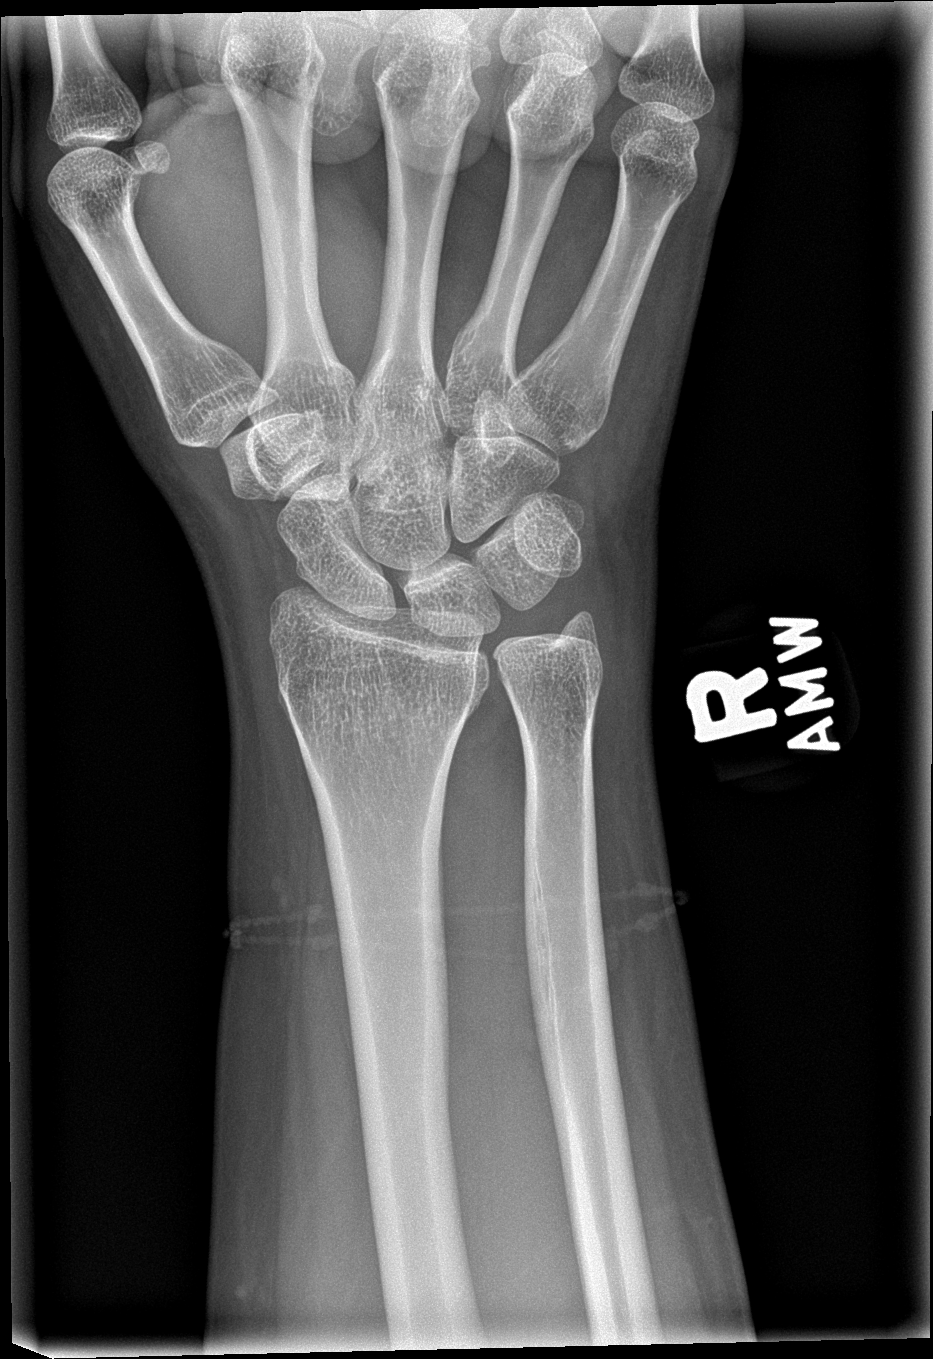

[wrist obl]
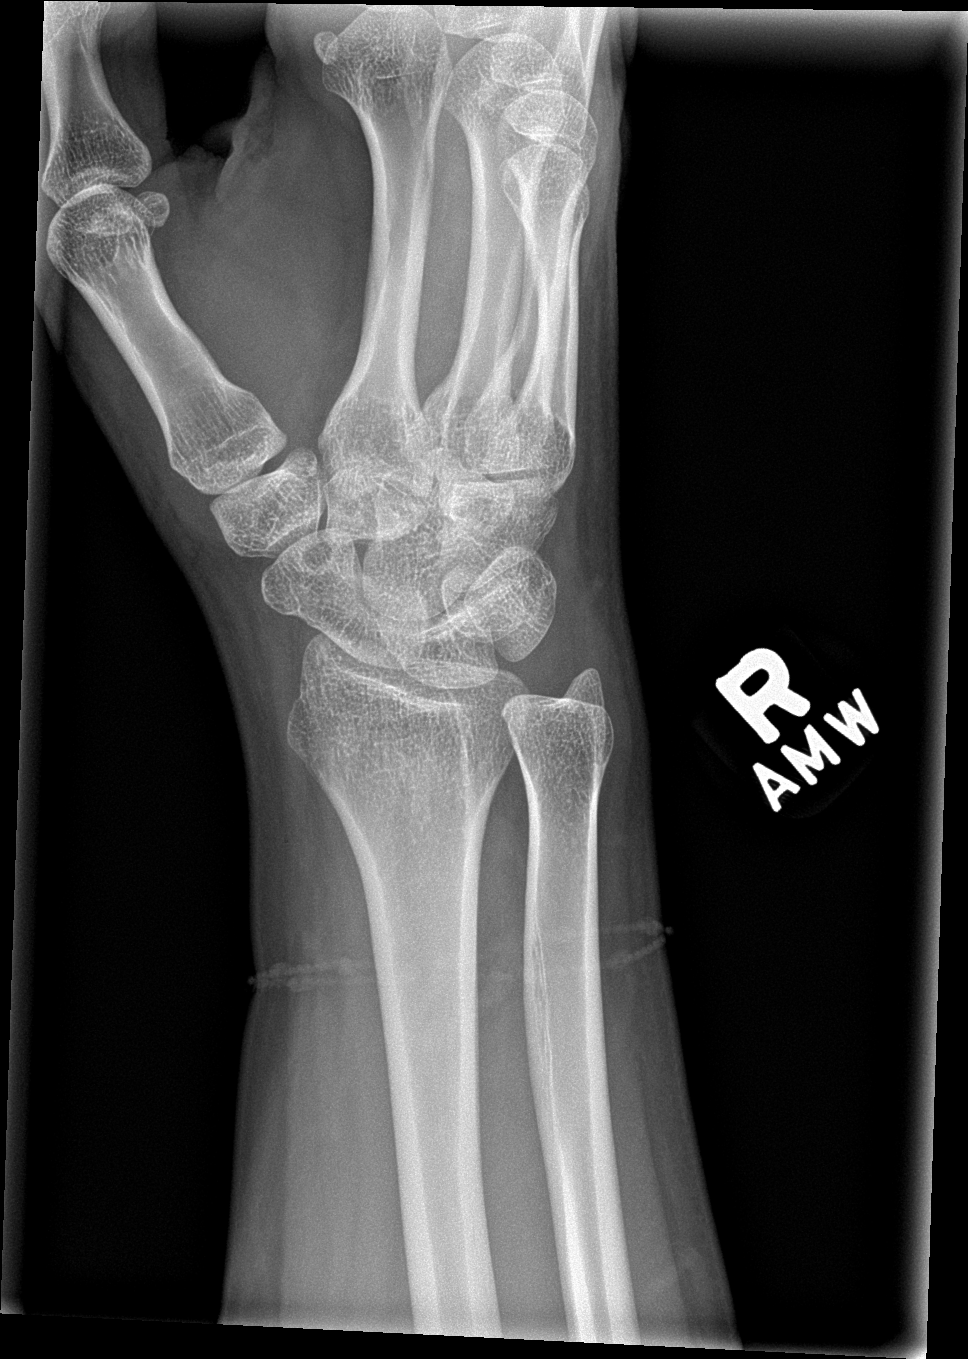

[wrist lat]
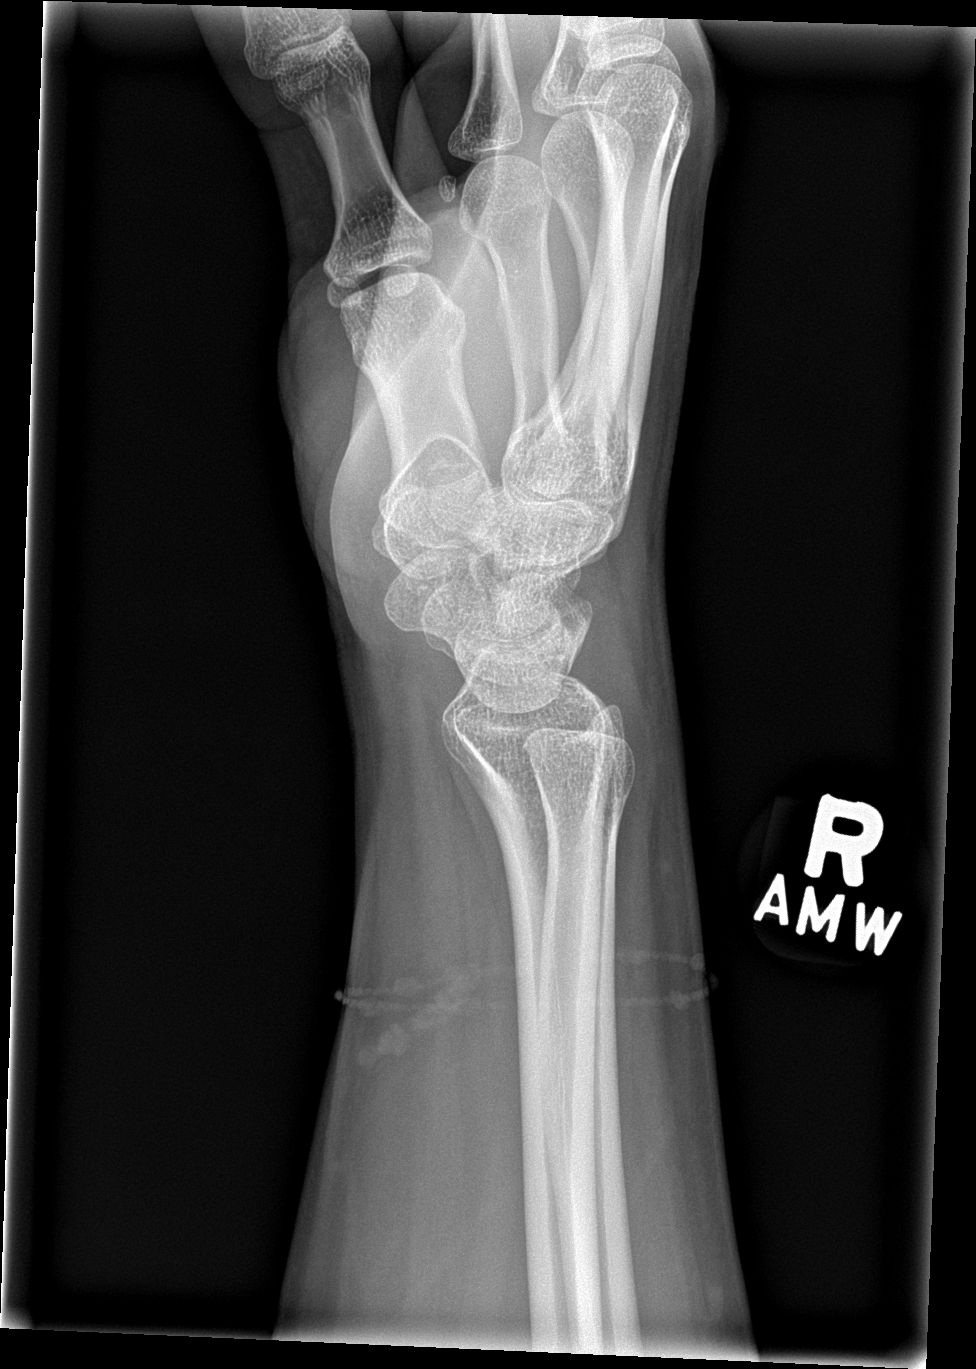

[wrist navicular]
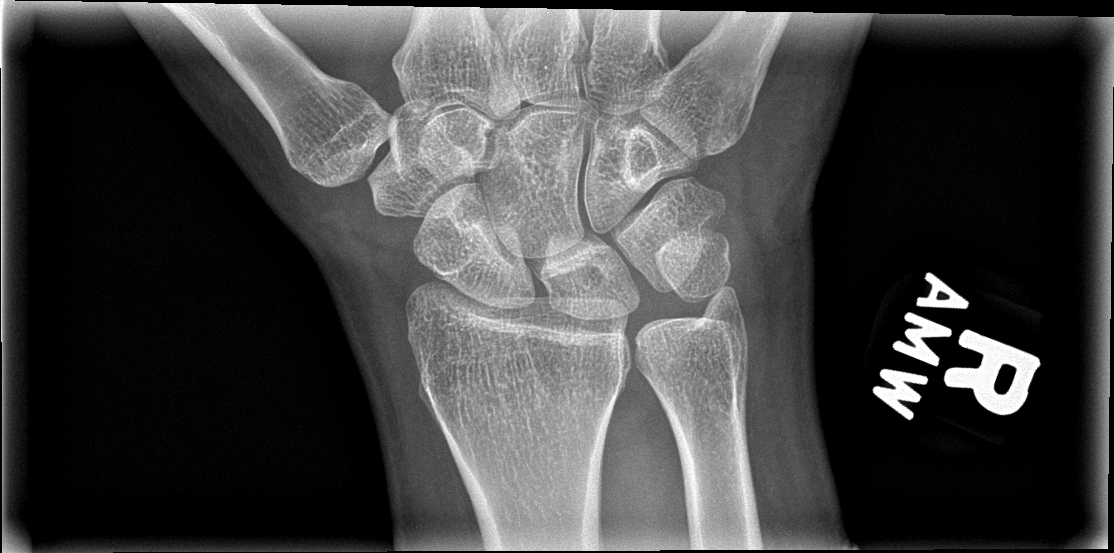

[4 of 4 positions shown; findings below may reference images not displayed]

FINDINGS: Cyst within the distal scaphoid appears benign. No acute bony
abnormality. Specifically, no fracture, subluxation, or dislocation.
Joint spaces maintained.
IMPRESSION: No acute bony abnormality.

## 2022-09-12 ENCOUNTER — Other Ambulatory Visit: Payer: Self-pay | Admitting: Family Medicine

## 2022-09-12 DIAGNOSIS — O99340 Other mental disorders complicating pregnancy, unspecified trimester: Secondary | ICD-10-CM

## 2022-09-12 MED ORDER — DULOXETINE HCL 20 MG PO CPEP: 40.0000 mg | ORAL_CAPSULE | Freq: Every day | ORAL | 0 refills | Status: AC

## 2022-09-12 NOTE — Telephone Encounter (Signed)
Medication Refill - Medication: DULoxetine (CYMBALTA) 20 MG capsule [[id number]]    Has the patient contacted their pharmacy? No. (Agent: If no, request that the patient contact the pharmacy for the refill. If patient does not wish to contact the pharmacy document the reason why and proceed with request.) (Agent: If yes, when and what did the pharmacy advise?)  Preferred Pharmacy (with phone number or street name):  [facility], [place] the patient been seen for an appointment in the last year OR does the patient have an upcoming appointment? Yes.    Agent: Please be advised that RX refills may take up to 3 business days. We ask that you follow-up with your pharmacy.
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
## 2022-09-28 ENCOUNTER — Ambulatory Visit
Admission: EM | Admit: 2022-09-28 | Discharge: 2022-09-28 | Disposition: A | Discharge status: Home / Self Care | Payer: Medicaid Other | Attending: Physician Assistant | Admitting: Physician Assistant

## 2022-09-28 ENCOUNTER — Encounter: Payer: Self-pay | Admitting: Emergency Medicine

## 2022-09-28 DIAGNOSIS — J111 Influenza due to unidentified influenza virus with other respiratory manifestations: Secondary | ICD-10-CM | POA: Diagnosis not present

## 2022-09-28 DIAGNOSIS — R051 Acute cough: Secondary | ICD-10-CM

## 2022-09-28 DIAGNOSIS — Z1152 Encounter for screening for COVID-19: Secondary | ICD-10-CM | POA: Insufficient documentation

## 2022-09-28 LAB — RESP PANEL BY RT-PCR (RSV, FLU A&B, COVID)  RVPGX2
Influenza A by PCR: NEGATIVE
Influenza B by PCR: NEGATIVE
Resp Syncytial Virus by PCR: NEGATIVE
SARS Coronavirus 2 by RT PCR: NEGATIVE

## 2022-09-28 MED ORDER — PROMETHAZINE-DM 6.25-15 MG/5ML PO SYRP: 5.0000 mL | ORAL_SOLUTION | Freq: Four times a day (QID) | ORAL | 0 refills | Status: AC | PRN

## 2022-09-28 MED ORDER — OSELTAMIVIR PHOSPHATE 75 MG PO CAPS: 75.0000 mg | ORAL_CAPSULE | Freq: Two times a day (BID) | ORAL | 0 refills | Status: AC

## 2022-09-28 NOTE — ED Triage Notes (Signed)
Patient c/o sore throat, headache, cough, chills that started today.  Patient denies fevers.
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
## 2022-09-28 NOTE — Discharge Instructions (Signed)
-  You have the flu. I sent Tamiflu.  Take cough medicine as needed, rest and fluids. - Should be feeling better over the next 1 to 2 weeks.  Need to be seen again if uncontrolled fever, weakness or breathing difficulty.

## 2022-10-15 ENCOUNTER — Ambulatory Visit
Admission: RE | Admit: 2022-10-15 | Discharge: 2022-10-15 | Disposition: A | Discharge status: Home / Self Care | Payer: Medicaid Other | Source: Ambulatory Visit | Attending: Physician Assistant | Admitting: Physician Assistant

## 2022-10-15 VITALS — BP 101/71 | HR 111 | Temp 98.7°F | Resp 16

## 2022-10-15 DIAGNOSIS — J020 Streptococcal pharyngitis: Secondary | ICD-10-CM | POA: Diagnosis not present

## 2022-10-15 LAB — GROUP A STREP BY PCR: Group A Strep by PCR: DETECTED — AB

## 2022-10-15 MED ORDER — AMOXICILLIN 500 MG PO CAPS: 500.0000 mg | ORAL_CAPSULE | Freq: Two times a day (BID) | ORAL | 0 refills | Status: AC

## 2022-10-15 NOTE — Discharge Instructions (Addendum)
-  Positive for strep. -Amoxil sent to pharmacy.  Encouraged rest and fluids. - Tylenol Motrin as needed for pain and fever but should be feeling better complaints. - Need to be seen again if you are not improving in the next 2 to 3 days, you have uncontrolled fever, weakness or trouble managing her secretions.

## 2022-10-15 NOTE — ED Triage Notes (Signed)
Pt c/o sore throat that started yesterday.
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
## 2022-10-20 ENCOUNTER — Other Ambulatory Visit: Payer: Self-pay | Admitting: Cardiovascular Disease

## 2022-12-05 ENCOUNTER — Other Ambulatory Visit: Payer: Self-pay | Admitting: Cardiovascular Disease

## 2023-02-27 ENCOUNTER — Ambulatory Visit: Payer: Medicaid Other | Admitting: Physician Assistant

## 2023-02-27 ENCOUNTER — Encounter: Payer: Self-pay | Admitting: Physician Assistant

## 2023-02-27 ENCOUNTER — Other Ambulatory Visit: Payer: Self-pay

## 2023-02-27 ENCOUNTER — Telehealth: Payer: Self-pay | Admitting: Physician Assistant

## 2023-02-27 ENCOUNTER — Ambulatory Visit: Payer: Self-pay

## 2023-02-27 VITALS — BP 110/68 | HR 95 | Resp 16 | Ht 64.0 in | Wt 170.0 lb

## 2023-02-27 DIAGNOSIS — R739 Hyperglycemia, unspecified: Secondary | ICD-10-CM

## 2023-02-27 DIAGNOSIS — R519 Headache, unspecified: Secondary | ICD-10-CM

## 2023-02-27 DIAGNOSIS — H539 Unspecified visual disturbance: Secondary | ICD-10-CM | POA: Diagnosis not present

## 2023-02-27 DIAGNOSIS — R42 Dizziness and giddiness: Secondary | ICD-10-CM

## 2023-02-27 DIAGNOSIS — G43809 Other migraine, not intractable, without status migrainosus: Secondary | ICD-10-CM | POA: Diagnosis not present

## 2023-02-27 MED ORDER — EMGALITY 120 MG/ML ~~LOC~~ SOAJ
1.0000 mL | SUBCUTANEOUS | 1 refills | Status: AC
  Filled 2023-02-27: qty 3, fill #0

## 2023-02-27 NOTE — Telephone Encounter (Signed)
Called patient and advised on ED due to not being able to do STAT CT. Patient informed me her insurance isn't wanting to pay and having trouble running it through. She states she is calling them to figure out what is going on so she can get her imaging done. Patient verbalized understanding to ED due to symptoms.

## 2023-02-27 NOTE — Telephone Encounter (Signed)
Please advise pt since we were not able to get a stat head CT that I recommend she go to the ER for her head CT to r/o a stroke

## 2023-02-27 NOTE — Telephone Encounter (Signed)
    Chief Complaint: Seen in UC today. States "they were concerned about my blood glucose 160 fasting and said to see my doctor." Has dizziness as well. Asking for appointment as soon as possible. Symptoms: Above Frequency: This week Pertinent Negatives: Patient denies  Disposition: [] ED /[] Urgent Care (no appt availability in office) / [x] Appointment(In office/virtual)/ []  [facility]/ [] Home Care/ [] Refused Recommended Disposition /[] [facility]/ []  Follow-up with PCP Additional Notes: No history of diabetes.  Reason for Disposition  New-onset diabetes suspected (e.g., abnormally thirsty, frequent urination, weight loss)  Answer Assessment - Initial Assessment Questions 1. BLOOD GLUCOSE: "What is your blood glucose level?"      160 at UC 2. ONSET: "When did you check the blood glucose?"     Today 3. USUAL RANGE: "What is your glucose level usually?" (e.g., usual fasting morning value, usual evening value)     N/a 4. KETONES: "Do you check for ketones (urine or blood test strips)?" If Yes, ask: "What does the test show now?"      No 5. TYPE 1 or 2:  "Do you know what type of diabetes you have?"  (e.g., Type 1, Type 2, Gestational; doesn't know)      No 6. INSULIN: "Do you take insulin?" "What type of insulin(s) do you use? What is the mode of delivery? (syringe, pen; injection or pump)?"      No 7. DIABETES PILLS: "Do you take any pills for your diabetes?" If Yes, ask: "Have you missed taking any pills recently?"     No 8. OTHER SYMPTOMS: "Do you have any symptoms?" (e.g., fever, frequent urination, difficulty breathing, dizziness, weakness, vomiting)     Dizziness 9. PREGNANCY: "Is there any chance you are pregnant?" "When was your last menstrual period?"     No  Protocols used: Diabetes - High Blood Sugar-A-AH

## 2023-02-27 NOTE — Progress Notes (Addendum)
Established patient visit   Patient: Dominique Navarro   DOB: December 22, 1990   32 y.o. Female  MRN: [id number] Visit Date: 02/27/2023  Today's healthcare provider: Alfredia Ferguson, PA-C   Chief Complaint  Patient presents with   Dizziness   Subjective    Dizziness   Pt presents today with dizziness, unsteadyness and room spinning, x 1 day. Started last night persisting until this morning. She took a meclizine which hasn't helped. Reports some vision changes-- blurry vision. Thought she has some numbness/tingling to her right arm which has resolved.  Denies weakness, paresthesias to the right upper extremity currently, denies any changes lower right extremity.   Reports a headache that started ~ 30 minutes ago.  She has chronic migraines and has been without emgality. She usually takes maxalt for [employer].Has not taken today.   She went to urgent care this AM, had bw drawn, potassium was 3.3 and her blood glucose was 160. Denies history of DM.  Medications: Outpatient Medications Prior to Visit  Medication Sig   azelastine (ASTELIN) 0.1 % nasal spray SMARTSIG:1 Spray(s) Both Nares Twice Daily PRN   cyclobenzaprine (FLEXERIL) 5 MG tablet TAKE 1 TO 2 TABLETS DAILY AT HEADACHE ONSET.   DULoxetine (CYMBALTA) 20 MG capsule Take 2 capsules (40 mg total) by mouth daily.   EPINEPHrine 0.3 mg/0.3 mL IJ SOAJ injection Inject 0.3 mg into the muscle as needed for anaphylaxis.   fluticasone (FLONASE) 50 MCG/ACT nasal spray Place into both nostrils.   gabapentin (NEURONTIN) 300 MG capsule Take 300 mg by mouth at bedtime.   meclizine (ANTIVERT) 12.5 MG tablet Take 12.5 mg 1-3 times daily as needed for dizziness   meloxicam (MOBIC) 15 MG tablet TAKE 1 TABLET BY MOUTH EVERY DAY AS NEEDED FOR PAIN   methocarbamol (ROBAXIN-750) 750 MG tablet Take 1 tablet (750 mg total) by mouth every 8 (eight) hours as needed for muscle spasms.   metoprolol succinate (TOPROL-XL) 50 MG 24 hr tablet Take 50 mg by mouth  daily.   montelukast (SINGULAIR) 10 MG tablet TAKE 1 TABLET BY MOUTH EVERYDAY AT BEDTIME   Multiple Vitamin (MULTIVITAMIN WITH MINERALS) TABS tablet Take 1 tablet by mouth daily.   promethazine-dextromethorphan (PROMETHAZINE-DM) 6.25-15 MG/5ML syrup Take 5 mLs by mouth 4 (four) times daily as needed.   rizatriptan (MAXALT) 10 MG tablet Take 1 tablet (10 mg total) by mouth 2 (two) times daily as needed.   [DISCONTINUED] EMGALITY 120 MG/ML SOAJ Inject 1 mL into the skin every 30 (thirty) days.   Adapalene-Benzoyl Peroxide (EPIDUO FORTE) 0.3-2.5 % GEL Apply 1 application topically at bedtime. Pea size amount to face nightly for acne (Patient not taking: Reported on 02/27/2023)   albuterol (VENTOLIN HFA) 108 (90 Base) MCG/ACT inhaler Inhale 2 puffs into the lungs every 4 (four) hours as needed for wheezing or shortness of breath. (Patient not taking: Reported on 02/27/2023)   No facility-administered medications prior to visit.      Objective    BP 110/68   Pulse 95   Resp 16   Ht 5\' 4"  (1.626 m)   Wt 170 lb (77.1 kg)   SpO2 100%   BMI 29.18 kg/m   Physical Exam Constitutional:      General: She is awake.     Appearance: She is well-developed.  HENT:     Head: Normocephalic.  Eyes:     Extraocular Movements: Extraocular movements intact.     Conjunctiva/sclera: Conjunctivae normal.     Pupils: Pupils  are equal, round, and reactive to light.  Cardiovascular:     Rate and Rhythm: Normal rate and regular rhythm.     Heart sounds: Normal heart sounds.  Pulmonary:     Effort: Pulmonary effort is normal.     Breath sounds: Normal breath sounds.  Skin:    General: Skin is warm.  Neurological:     Mental Status: She is alert and oriented to person, place, and time.     Motor: No weakness.     Gait: Gait is intact.     Comments: Sensation intact b/l upper extremity and face Facial movements equal b/l  No upper extremity weakness.    Psychiatric:        Attention and Perception:  Attention normal.        Mood and Affect: Mood normal.        Speech: Speech normal.        Behavior: Behavior is cooperative.      No results found for any visits on 02/27/23.  Assessment & Plan     1. Hyperglycemia - HgB A1c  2. Other migraine without status migrainosus, not intractable Refilled emgality for pt - EMGALITY 120 MG/ML SOAJ; Inject 1 mL into the skin every 30 (thirty) days.  Dispense: 3 mL; Refill: 1  3. Acute intractable headache, unspecified headache type 4. Dizziness 5. Vision changes Given vision changes, ordering stat head ct. Advised pt if this does not happen today to go to ED for head CT.  Ok to take maxalt -- may be a new type of migraine w/ prodrome/aura for her. No orthostatic bp changes.  HR increased to 123 when sitting -> standing  - CT HEAD WO CONTRAST ( )  I, Alfredia Ferguson, PA-C have reviewed all documentation for this visit. The documentation on  02/27/23   for the exam, diagnosis, procedures, and orders are all accurate and complete.  Alfredia Ferguson, PA-C [facility] [address] [place], [place], [address] Office: [phone] Fax: [phone]   [facility]

## 2023-02-28 ENCOUNTER — Encounter: Payer: Self-pay | Admitting: Physician Assistant

## 2023-02-28 LAB — HEMOGLOBIN A1C
Est. average glucose Bld gHb Est-mCnc: 103 mg/dL
Hgb A1c MFr Bld: 5.2 % (ref 4.8–5.6)

## 2023-03-12 ENCOUNTER — Ambulatory Visit: Payer: Self-pay

## 2023-04-02 DIAGNOSIS — R87629 Unspecified abnormal cytological findings in specimens from vagina: Secondary | ICD-10-CM

## 2023-04-02 HISTORY — DX: Unspecified abnormal cytological findings in specimens from vagina: R87.629

## 2023-04-07 ENCOUNTER — Other Ambulatory Visit: Payer: Self-pay

## 2023-04-07 ENCOUNTER — Emergency Department
Admission: EM | Admit: 2023-04-07 | Discharge: 2023-04-07 | Disposition: A | Discharge status: Home / Self Care | Payer: Medicaid Other | Attending: Emergency Medicine | Admitting: Emergency Medicine

## 2023-04-07 ENCOUNTER — Emergency Department: Payer: Medicaid Other

## 2023-04-07 DIAGNOSIS — R0789 Other chest pain: Secondary | ICD-10-CM | POA: Insufficient documentation

## 2023-04-07 DIAGNOSIS — R42 Dizziness and giddiness: Secondary | ICD-10-CM | POA: Insufficient documentation

## 2023-04-07 LAB — CBC
HCT: 41.4 % (ref 36.0–46.0)
Hemoglobin: 13.8 g/dL (ref 12.0–15.0)
MCH: 28.5 pg (ref 26.0–34.0)
MCHC: 33.3 g/dL (ref 30.0–36.0)
MCV: 85.5 fL (ref 80.0–100.0)
Platelets: 276 10*3/uL (ref 150–400)
RBC: 4.84 MIL/uL (ref 3.87–5.11)
RDW: 12 % (ref 11.5–15.5)
WBC: 6.3 10*3/uL (ref 4.0–10.5)
nRBC: 0 % (ref 0.0–0.2)

## 2023-04-07 LAB — BASIC METABOLIC PANEL
Anion gap: 8 (ref 5–15)
BUN: 12 mg/dL (ref 6–20)
CO2: 23 mmol/L (ref 22–32)
Calcium: 9 mg/dL (ref 8.9–10.3)
Chloride: 107 mmol/L (ref 98–111)
Creatinine, Ser: 0.98 mg/dL (ref 0.44–1.00)
GFR, Estimated: >60 mL/min (ref >60)
Glucose, Bld: 100 mg/dL — ABNORMAL HIGH (ref 70–99)
Potassium: 3.2 mmol/L — ABNORMAL LOW (ref 3.5–5.1)
Sodium: 138 mmol/L (ref 135–145)

## 2023-04-07 LAB — D-DIMER, QUANTITATIVE: D-Dimer, Quant: 0.49 ug{FEU}/mL (ref 0.00–0.50)

## 2023-04-07 LAB — TROPONIN I (HIGH SENSITIVITY)
Troponin I (High Sensitivity): <2 ng/L
Troponin I (High Sensitivity): <2 ng/L

## 2023-04-07 NOTE — ED Provider Notes (Signed)
Emergency department handoff note  Care of this patient was signed out to me at the end of the previous provider shift.  All pertinent patient information was conveyed and all questions were answered.  Patient pending D-dimer and troponin which both are negative.  Patient's EKG on repeat shows no signs of ischemia.  The patient has been reexamined and is ready to be discharged.  All diagnostic results have been reviewed and discussed with the patient/family.  Care plan has been outlined and the patient/family understands all current diagnoses, results, and treatment plans.  There are no new complaints, changes, or physical findings at this time.  All questions have been addressed and answered.  All medications, if any, that were given while in the emergency department or any that are being prescribed have been reviewed with the patient/family.  All side effects and adverse reactions have been explained.  Patient was instructed to, and agrees to follow-up with their primary care physician as well as return to the emergency department if any new or worsening symptoms develop.   Merwyn Katos, MD 04/07/23 1630

## 2023-04-07 NOTE — Discharge Instructions (Signed)
Please use ibuprofen (Motrin) up to 800 mg every 8 hours, naproxen (Naprosyn) up to 500 mg every 12 hours, and/or acetaminophen (Tylenol) up to 4 g/day for any continued pain.  Please do not use this medication regimen for longer than 7 days

## 2023-04-07 NOTE — ED Notes (Signed)
Pt verbalizes understanding of discharge instructions. Opportunity for questioning and answers were provided. Pt discharged from ED to home.   ? ?

## 2023-04-07 NOTE — ED Triage Notes (Signed)
Pt sts that she has been having CP since this AM. Pt sts that as the day progressed she has been having some left arm pain and some dizziness.

## 2023-04-07 NOTE — ED Provider Notes (Signed)
[facility] Provider Note    Event Date/Time   First MD Initiated Contact with Patient 04/07/23 1422     (approximate)   History   Chest Pain   HPI  Dominique Navarro is a 32 y.o. female with no significant past medical history who presents with complaints of left-sided chest discomfort.  Patient reports that started around 6 AM this morning, she went to work and it seemed to worsen.  She briefly had discomfort in her left arm and felt a little dizzy.  Now she feels essentially at her baseline.  She reports a sensation of having to take a deep breath.  No cough fevers or chills.  No recent travel.  No leg pain or swelling.     Physical Exam   Triage Vital Signs: ED Triage Vitals  Encounter Vitals Group     BP 04/07/23 1245 121/83     Systolic BP Percentile --      Diastolic BP Percentile --      Pulse Rate 04/07/23 1245 (!) 107     Resp 04/07/23 1245 17     Temp 04/07/23 1245 97.8 F (36.6 C)     Temp Source 04/07/23 1245 Oral     SpO2 04/07/23 1245 100 %     Weight 04/07/23 1246 77.1 kg (170 lb)     Height 04/07/23 1246 1.626 m (5\' 4" )     Head Circumference --      Peak Flow --      Pain Score 04/07/23 1301 8     Pain Loc --      Pain Education --      Exclude from Growth Chart --     Most recent vital signs: Vitals:   04/07/23 1245  BP: 121/83  Pulse: (!) 107  Resp: 17  Temp: 97.8 F (36.6 C)  SpO2: 100%     General: Awake, no distress.  CV:  Good peripheral perfusion.  Resp:  Normal effort.  Clear to auscultation bilaterally Abd:  No distention.  Other:  No calf pain or swelling   ED Results / Procedures / Treatments   Labs (all labs ordered are listed, but only abnormal results are displayed) Labs Reviewed  BASIC METABOLIC PANEL - Abnormal; Notable for the following components:      Result Value   Potassium 3.2 (*)    Glucose, Bld 100 (*)    All other components within normal limits  CBC  D-DIMER, QUANTITATIVE  POC  URINE PREG, ED  TROPONIN I (HIGH SENSITIVITY)  TROPONIN I (HIGH SENSITIVITY)     EKG  ED ECG REPORT I, Jene Every, the attending physician, personally viewed and interpreted this ECG.  Date: 04/07/2023  Rhythm: normal sinus rhythm QRS Axis: normal Intervals: normal ST/T Wave abnormalities: Nonspecific changes Narrative Interpretation: no evidence of acute ischemia    RADIOLOGY Chest x-ray viewed interpreted by me, no acute abnormality    PROCEDURES:  Critical Care performed:   Procedures   MEDICATIONS ORDERED IN ED: Medications - No data to display   IMPRESSION / MDM / ASSESSMENT AND PLAN / ED COURSE  I reviewed the triage vital signs and the nursing notes. Patient's presentation is most consistent with acute presentation with potential threat to life or bodily function.  Patient presents with chest pain as detailed above, differential includes myocarditis, pneumonia, less likely PE, less likely ACS  Overall well-appearing at this time, initially mildly tachycardic.  No risk factors for PE.  Initial  troponin normal, labs chest x-ray reassuring  Will send second troponin, add D-dimer, repeat EKG  If reassuring appropriate for discharge with outpatient follow-up as needed.        FINAL CLINICAL IMPRESSION(S) / ED DIAGNOSES   Final diagnoses:  Atypical chest pain     Rx / DC Orders   ED Discharge Orders     None        Note:  This document was prepared using Dragon voice recognition software and may include unintentional dictation errors.   Jene Every, MD 04/07/23 [phone]

## 2023-04-22 ENCOUNTER — Ambulatory Visit (LOCAL_COMMUNITY_HEALTH_CENTER): Payer: Self-pay | Admitting: Advanced Practice Midwife

## 2023-04-22 VITALS — BP 105/73 | HR 77 | Ht 64.0 in | Wt 168.6 lb

## 2023-04-22 DIAGNOSIS — Z7289 Other problems related to lifestyle: Secondary | ICD-10-CM | POA: Insufficient documentation

## 2023-04-22 DIAGNOSIS — Z01419 Encounter for gynecological examination (general) (routine) without abnormal findings: Secondary | ICD-10-CM

## 2023-04-22 DIAGNOSIS — F4310 Post-traumatic stress disorder, unspecified: Secondary | ICD-10-CM | POA: Insufficient documentation

## 2023-04-22 DIAGNOSIS — F419 Anxiety disorder, unspecified: Secondary | ICD-10-CM | POA: Insufficient documentation

## 2023-04-22 DIAGNOSIS — Z3009 Encounter for other general counseling and advice on contraception: Secondary | ICD-10-CM

## 2023-04-22 DIAGNOSIS — F909 Attention-deficit hyperactivity disorder, unspecified type: Secondary | ICD-10-CM | POA: Insufficient documentation

## 2023-04-22 LAB — WET PREP FOR TRICH, YEAST, CLUE
Trichomonas Exam: NEGATIVE
Yeast Exam: NEGATIVE

## 2023-04-22 MED ORDER — METRONIDAZOLE 500 MG PO TABS
500.0000 mg | ORAL_TABLET | Freq: Two times a day (BID) | ORAL | Status: AC
Start: 2023-04-22 — End: 2023-04-29

## 2023-04-22 NOTE — Progress Notes (Signed)
 Pt is here for family planning visit.  Family planning packet reviewed and given to pt.  Wet prep results reviewed, treatment required per standing orders. Condoms declined. The patient was dispensed metronidazole today. I provided counseling today regarding the medication. We discussed the medication, the side effects and when to call clinic. Patient given the opportunity to ask questions. Questions answered.   Gaspar Garbe, RN
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
## 2023-05-03 LAB — IGP, APTIMA HPV
HPV Aptima: POSITIVE — AB
PAP Smear Comment: 0

## 2023-05-04 ENCOUNTER — Telehealth: Payer: Self-pay

## 2023-05-04 ENCOUNTER — Encounter: Payer: Self-pay | Admitting: Advanced Practice Midwife

## 2023-05-04 DIAGNOSIS — R87619 Unspecified abnormal cytological findings in specimens from cervix uteri: Secondary | ICD-10-CM | POA: Insufficient documentation

## 2023-05-04 NOTE — Telephone Encounter (Signed)
Call pt re pap smear results from 04/22/23 specimen and colpo referral. ASCUS, Pos HPV.  Confirm insurance status, discuss [facility].

## 2023-05-07 NOTE — Telephone Encounter (Signed)
05/07/23 Colpo Referral sent to Joellyn Quails with [facility] through private messaging.

## 2023-05-07 NOTE — Telephone Encounter (Signed)
Phone call to pt at [phone]. Pt confirmed identity. Counseled re pap smear results and colpo referral.  Pt states she does not have any kind of insurance at this point, trying to get Medicaid, but no approval at this time. Pt in interested in going through [facility] for colposcopy.
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
## 2023-05-27 ENCOUNTER — Ambulatory Visit: Payer: Self-pay | Admitting: Cardiology

## 2023-06-30 ENCOUNTER — Ambulatory Visit: Payer: Self-pay | Attending: Hematology and Oncology | Admitting: Hematology and Oncology

## 2023-06-30 ENCOUNTER — Other Ambulatory Visit (HOSPITAL_COMMUNITY)
Admission: RE | Admit: 2023-06-30 | Discharge: 2023-06-30 | Disposition: A | Discharge status: Home / Self Care | Payer: Self-pay | Source: Ambulatory Visit | Attending: Hematology and Oncology | Admitting: Hematology and Oncology

## 2023-06-30 ENCOUNTER — Other Ambulatory Visit: Payer: Self-pay

## 2023-06-30 VITALS — BP 121/81 | Wt 170.0 lb

## 2023-06-30 DIAGNOSIS — R87610 Atypical squamous cells of undetermined significance on cytologic smear of cervix (ASC-US): Secondary | ICD-10-CM

## 2023-06-30 DIAGNOSIS — R87810 Cervical high risk human papillomavirus (HPV) DNA test positive: Secondary | ICD-10-CM | POA: Insufficient documentation

## 2023-06-30 NOTE — Progress Notes (Signed)
[name] is a 32 y.o. female who presents to [facility] clinic today with no complaints.    Pap Smear: Pap not smear completed today. Last Pap smear was 04/22/23 and was abnormal - ASCUS/ HPV+ . Per patient has no history of an abnormal Pap smear. Last Pap smear result is available in Epic.   Physical exam: Breasts Breasts symmetrical. No skin abnormalities bilateral breasts. No nipple retraction bilateral breasts. No nipple discharge bilateral breasts. No lymphadenopathy. No lumps palpated bilateral breasts.       Pelvic/Bimanual Pap is not indicated today   GYNECOLOGY CLINIC COLPOSCOPY PROCEDURE NOTE  [name] is a 32 y.o. 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 Discussed role for HPV in cervical dysplasia, need for surveillance.  Patient given informed consent, signed copy in the chart, time out was performed.  Placed in lithotomy position. Cervix viewed with speculum and colposcope after application of acetic acid.   Colposcopy adequate? Yes  no visible lesions.  ECC specimen obtained. All specimens were labelled and sent to pathology.  Patient was given post procedure instructions.  Will follow up pathology and manage accordingly.  Routine preventative health maintenance measures emphasized.   Smoking History: Patient has is a former smoker and was not referred to quit line.    Patient Navigation: Patient education provided. Access to services provided for patient through [facility] program. No interpreter provided. No transportation provided   Colorectal Cancer Screening: Per patient has never had colonoscopy completed No complaints today.    Breast and Cervical Cancer Risk Assessment: Patient does not have family history of breast cancer, known genetic mutations, or radiation treatment to the chest before age 48. Patient has history of cervical dysplasia, immunocompromised, or DES exposure in-utero.  Risk Assessment   No  risk assessment data     A: [facility] exam without pap smear No complaint with benign exam. Colposcopy as above.   P: Will follow up accordingly once pathology is obtained. Refer to gynecology if high grade lesion, repeat Pap smear in one year for benign or low grade lesions.   Pascal Lux, NP 06/30/2023 12:31 PM

## 2023-07-02 LAB — SURGICAL PATHOLOGY

## 2023-07-08 ENCOUNTER — Telehealth: Payer: Self-pay

## 2023-07-08 NOTE — Telephone Encounter (Signed)
Patient called and requested to discuss colposcopy and HPV results. Per Dominique Navarro, Patient informed colposcopy is benign, needs to repeat pap smear in 1 year. Discussed HRHPV, all questions answered. Patient verbalized understanding.

## 2023-07-20 NOTE — Progress Notes (Signed)
Colpo completed by Pascal Lux, NP with [facility] on 06/30/23.  Reference surgical pathology lab result in Epic dated 06/30/23.  Copied and pasted from follow-up letter dated 07/16/23 letter sent to patient from provider's office:   "Your colposcopy (biopsy) results indicate a "benign" finding, meaning a non-cancerous finding. Your 04/22/2023 PAP result was ASC-US, which is the most common abnormal PAP test result. ASC-US stands for atypical squamous cells of undetermined significance. This means that changes in the cervical cells have been found. The changes are almost always a sign of an HPV infection. Sometimes it clears up on its own, that is why follow-up testing is important.  It is recommended that you repeat PAP testing in one year."

## 2024-01-03 ENCOUNTER — Ambulatory Visit
Admission: EM | Admit: 2024-01-03 | Discharge: 2024-01-03 | Disposition: A | Discharge status: Home / Self Care | Payer: Self-pay | Attending: Emergency Medicine | Admitting: Emergency Medicine

## 2024-01-03 ENCOUNTER — Encounter: Payer: Self-pay | Admitting: *Deleted

## 2024-01-03 DIAGNOSIS — G43019 Migraine without aura, intractable, without status migrainosus: Secondary | ICD-10-CM

## 2024-01-03 MED ORDER — KETOROLAC TROMETHAMINE 30 MG/ML IJ SOLN
30.0000 mg | Freq: Once | INTRAMUSCULAR | Status: AC
  Administered 2024-01-03: 30 mg via INTRAMUSCULAR

## 2024-01-03 MED ORDER — ONDANSETRON 4 MG PO TBDP: 4.0000 mg | ORAL_TABLET | Freq: Three times a day (TID) | ORAL | 0 refills | Status: DC | PRN

## 2024-01-03 MED ORDER — ONDANSETRON HCL 4 MG/2ML IJ SOLN
4.0000 mg | Freq: Once | INTRAMUSCULAR | Status: AC
  Administered 2024-01-03: 4 mg via INTRAMUSCULAR

## 2024-01-03 NOTE — ED Triage Notes (Signed)
 Patient states migraine since yesterday, 10 of 10 pain.  Took Aspirin 650mg  this morning
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
## 2024-01-03 NOTE — Discharge Instructions (Addendum)
 For your headache -On exam there are no abnormalities neurologically -You have been given an injection of Toradol  and zofran here in the office today to help minimize your symptoms -may use zofran every 8 hours at home as needed for nausea, placed under tongue and allow to dissolve -While headaches are present ensure that you are getting adequate rest and adequate fluid intake -Participate in low stimulation activities avoiding bright lights and loud noises when symptoms are present -If your headaches continue to persist please follow-up with your primary doctor for reevaluation -At any point if you have the worst headache of your life please go to the nearest emergency department for immediate evaluation

## 2024-05-06 ENCOUNTER — Ambulatory Visit: Payer: Self-pay

## 2024-05-12 ENCOUNTER — Ambulatory Visit: Payer: Self-pay

## 2024-05-20 ENCOUNTER — Ambulatory Visit: Payer: Self-pay

## 2024-05-26 ENCOUNTER — Ambulatory Visit: Payer: Self-pay

## 2024-05-26 ENCOUNTER — Encounter: Payer: Self-pay | Admitting: Physician Assistant

## 2024-05-26 VITALS — BP 107/70 | HR 67 | Temp 98.3°F | Wt 168.6 lb

## 2024-05-26 DIAGNOSIS — Z3009 Encounter for other general counseling and advice on contraception: Secondary | ICD-10-CM

## 2024-05-26 DIAGNOSIS — R87610 Atypical squamous cells of undetermined significance on cytologic smear of cervix (ASC-US): Secondary | ICD-10-CM

## 2024-05-26 DIAGNOSIS — Z01419 Encounter for gynecological examination (general) (routine) without abnormal findings: Secondary | ICD-10-CM

## 2024-05-26 LAB — WET PREP FOR TRICH, YEAST, CLUE
Clue Cell Exam: POSITIVE — AB
Trichomonas Exam: NEGATIVE
Yeast Exam: NEGATIVE

## 2024-05-26 LAB — HM HIV SCREENING LAB: HM HIV Screening: NEGATIVE

## 2024-05-26 MED ORDER — NORETHINDRONE 0.35 MG PO TABS
1.0000 | ORAL_TABLET | Freq: Every day | ORAL | 13 refills | Status: AC
Start: 2024-05-26 — End: ?

## 2024-05-26 NOTE — Progress Notes (Signed)
 Wet prep reviewed by A. Streilein PA-C (positive clue cell) and no treatment indicated. Norethindrone ocps (13 packs) dispensed as per order. Client counseled on how to take and interventions needed if ocp missed. Understanding verbalized. Burnadette Lowers, RN
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
## 2024-05-27 LAB — HGB A1C W/O EAG: Hgb A1c MFr Bld: 5 % (ref 4.8–5.6)

## 2024-05-30 ENCOUNTER — Ambulatory Visit: Payer: Self-pay | Admitting: Family Medicine

## 2024-05-30 NOTE — Progress Notes (Signed)
 Wet prep reviewed at clinic visit. A1c wnl.   Dominique Helling, MD 05/30/24  12:18 PM

## 2024-06-01 LAB — IGP, APTIMA HPV
HPV Aptima: POSITIVE — AB
PAP Smear Comment: 0

## 2024-06-01 NOTE — Addendum Note (Signed)
 Addended by: Tracey Hermance on: 06/01/2024 04:37 PM   Modules accepted: Level of Service

## 2024-06-02 NOTE — Progress Notes (Signed)
 Reviewed - Pap ASCUS and HPV pos. Discussed with Dr. Macario. Recommend refer for repeat colposcopy. Please send letter to patient and arrange referral. Tejasvi Brissett PA-C

## 2024-06-30 ENCOUNTER — Telehealth: Payer: Self-pay | Admitting: Family Medicine

## 2024-07-15 ENCOUNTER — Ambulatory Visit: Admission: EM | Admit: 2024-07-15 | Discharge: 2024-07-15 | Disposition: A | Discharge status: Home / Self Care

## 2024-07-15 DIAGNOSIS — R519 Headache, unspecified: Secondary | ICD-10-CM

## 2024-07-15 HISTORY — DX: Other specified disorders of muscle: M62.89
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
## 2024-07-15 NOTE — ED Triage Notes (Signed)
 Pt c/o migraine x1day  Pt states that it has gotten worse  Pt states she normally takes OTC medication like aspirin.  Pt asks for a Toradol  injection

## 2024-07-15 NOTE — ED Notes (Signed)
 Patient is being discharged from the Urgent Care and sent to the Emergency Department via private vehicle . Per Rilla Flood, NP, patient is in need of higher level of care due to worst headache. Patient is aware and verbalizes understanding of plan of care.  Vitals:   07/15/24 0953  BP: 115/85  Pulse: 86  Resp: 12  Temp: 98.2 F (36.8 C)  SpO2: 100%

## 2024-07-15 NOTE — Discharge Instructions (Signed)
 Go to ER for further evaluation of worst HA of life

## 2024-07-16 ENCOUNTER — Ambulatory Visit: Payer: Self-pay

## 2024-07-19 NOTE — Progress Notes (Signed)
  [facility]  Outpatient Colposcopy Procedure Note   PRE-PROCEDURE DIAGNOSIS: ASCUS with positive high risk HPV cervical [R87.610, R87.810]    PROCEDURE: Colposcopy of cervix with biopsy.   CONSENT: The risks and benefits of the procedure were reviewed and informed consent obtained, see separate documentation.  TIMEOUT: See separate timeout documentation.  Pre-procedure pain score: Pain Score %%: 0-No pain  PROCEDURE IN DETAIL:  Internal Colposcopy:  Speculum was placed in the vagina and excellent visualization was achieved.   Colposcopy was adequate.  Cervix: Acetic acid applied, topical anesthetic applied to cervix, and cervical biopsy/ies taken at 2, 6, 10 o'clock. no mosaicism, no punctation, and acetowhite lesion(s) noted: 9-11, 2, 5-7 o'clock.   Vagina: vaginal colposcopy not performed.   External Colposcopy:  Vulva: vulvar colposcopy not performed.  Photographs taken: no  Specimens: cervical biopsy at 2, 6, 10 o'clock.  Orders Placed This Encounter  Procedures  . POC Urine HCG Pregnancy Test  . Pathology - GU     Complications: none.  CONDITION:  Post-procedure evaluation the patient tolerated the procedure well. Post-procedure pain score: Pain Score %%: 0-No pain  The post-procedure instructions were reviewed with the patient and she expressed understanding. The patient does not have any barriers to learning.  CHIQUITA JOSEPHINA SEARCH, NP

## 2024-07-29 ENCOUNTER — Encounter: Payer: Self-pay | Admitting: Physician Assistant

## 2024-07-29 ENCOUNTER — Ambulatory Visit: Admitting: Physician Assistant

## 2024-07-29 VITALS — BP 108/73 | HR 90 | Wt 171.8 lb

## 2024-07-29 DIAGNOSIS — G43109 Migraine with aura, not intractable, without status migrainosus: Secondary | ICD-10-CM

## 2024-07-29 MED ORDER — NURTEC 75 MG PO TBDP: 75.0000 mg | ORAL_TABLET | ORAL | 11 refills | Status: AC

## 2024-07-29 MED ORDER — SUMATRIPTAN SUCCINATE 100 MG PO TABS: 100.0000 mg | ORAL_TABLET | Freq: Once | ORAL | 11 refills | Status: AC | PRN

## 2024-07-29 MED ORDER — ONDANSETRON 4 MG PO TBDP: 4.0000 mg | ORAL_TABLET | Freq: Three times a day (TID) | ORAL | 5 refills | Status: AC | PRN

## 2024-07-29 NOTE — Progress Notes (Signed)
 History:  Dominique Navarro is a 33 y.o. [id number] who presents to clinic today for migraine.  The migraines can be severe, behind eyes/forehead/behind ear and below jaw.  It is more often on the right.  There is throbbing.  It lasts several hours to several days.  She has aura of blurry vision (appears moving) or spots with dizziness.  Movement makes it worse, lights, noises too.  THere is nausea, no vomiting.  She was diagnosed with migraine in 2012.  She has previously used sumatriptan , topamax, emgality , zofran , metoprolol , rizatriptan . She has had a time with unprovoked tachycardia but saw cardiology and has not required further treatment. Sumatriptan  has helped with migraine but causes unpleasant side effects including nausea.  She has not tried adding ibuprofen .  She has seen Dr. Lauraine Rocks at [facility] for migraine.  She is no longer on metoprolol .  She does not need atenolol regularly - but when she gets URI - inhaler may be required.  She does not recall if maxalt  was effective.  She is not currently on Cymbalta .  Number of days in the last 4 weeks with:  Severe headache: 2 Moderate headache: 3 Mild headache: 0  No headache: 23  Past Medical History:  Diagnosis Date   Anxiety    Depression    Environmental allergies    Migraine    Pelvic floor dysfunction    Vaginal Pap smear, abnormal 04/02/2023   ASCUS, HPV positive    Social History   Socioeconomic History   Marital status: Married    Spouse name: Not on file   Number of children: 2   Years of education: Not on file   Highest education level: Not on file  Occupational History   Not on file  Tobacco Use   Smoking status: Former    Current packs/day: 0.00    Types: Cigarettes    Quit date: 02/23/2019    Years since quitting: 5.4   Smokeless tobacco: Never  Vaping Use   Vaping status: Former  Substance and Sexual Activity   Alcohol use: Never   Drug use: Never   Sexual activity: Not Currently    Birth  control/protection: Condom  Other Topics Concern   Not on file  Social History Narrative   Not on file   Social Drivers of Health   Financial Resource Strain: Low Risk  (07/01/2024)   Received from [facility]   Overall Financial Resource Strain (CARDIA)    Difficulty of Paying Living Expenses: Not hard at all  Food Insecurity: No Food Insecurity (07/01/2024)   Received from [facility]   Hunger Vital Sign    Within the past 12 months, you worried that your food would run out before you got the money to buy more.: Never true    Within the past 12 months, the food you bought just didn't last and you didn't have money to get more.: Never true  Transportation Needs: No Transportation Needs (07/01/2024)   Received from [facility] - Transportation    In the past 12 months, has lack of transportation kept you from medical appointments or from getting medications?: No    Lack of Transportation (Non-Medical): No  Physical Activity: Not on file  Stress: Stress Concern Present (05/31/2020)   [employer] [facility] [employer] - Occupational Stress Questionnaire    Feeling of Stress : To some extent  Social Connections: Unknown (02/10/2023)   Received from [facility]  Social Connections    How often do you feel lonely or isolated from those around you? (Adult - for ages 17 years and over): Not on file  Intimate Partner Violence: Unknown (05/26/2024)   Humiliation, Afraid, Rape, and Kick questionnaire    Fear of Current or Ex-Partner: No    Emotionally Abused: Patient declined    Physically Abused: No    Sexually Abused: No    Family History  Problem Relation Age of Onset   Asthma Mother    COPD Mother    COPD Father    Seizures Son     Allergies  Allergen Reactions   Bee Venom Hives   Elemental Sulfur Hives and Other (See Comments)    Other reaction(s): Other (See Comments) Other reaction(s): Other (See Comments)    Sulfa Antibiotics Hives   Topiramate Hives and Palpitations    Current Outpatient Medications on File Prior to Visit  Medication Sig Dispense Refill   cyclobenzaprine  (FLEXERIL ) 5 MG tablet      Multiple Vitamin (MULTIVITAMIN WITH MINERALS) TABS tablet Take 1 tablet by mouth daily.     ondansetron  (ZOFRAN -ODT) 4 MG disintegrating tablet Take 1 tablet (4 mg total) by mouth every 8 (eight) hours as needed. 20 tablet 0   Adapalene -Benzoyl Peroxide  (EPIDUO  FORTE) 0.3-2.5 % GEL Apply 1 application topically at bedtime. Pea size amount to face nightly for acne (Patient not taking: Reported on 02/27/2023) 60 g 3   albuterol  (VENTOLIN  HFA) 108 (90 Base) MCG/ACT inhaler Inhale 2 puffs into the lungs every 4 (four) hours as needed for wheezing or shortness of breath. 18 g 0   azelastine (ASTELIN) 0.1 % nasal spray SMARTSIG:1 Spray(s) Both Nares Twice Daily PRN (Patient not taking: Reported on 06/30/2023)     DULoxetine  (CYMBALTA ) 20 MG capsule Take 2 capsules (40 mg total) by mouth daily. (Patient not taking: Reported on 06/30/2023) 180 capsule 0   EMGALITY  120 MG/ML SOAJ Inject 1 mL into the skin every 30 (thirty) days. (Patient not taking: Reported on 06/30/2023) 3 mL 1   EPINEPHrine  0.3 mg/0.3 mL IJ SOAJ injection Inject 0.3 mg into the muscle as needed for anaphylaxis. (Patient not taking: Reported on 06/30/2023) 1 each 1   fluticasone (FLONASE) 50 MCG/ACT nasal spray Place into both nostrils. (Patient not taking: Reported on 06/30/2023)     meclizine (ANTIVERT) 12.5 MG tablet Take 12.5 mg 1-3 times daily as needed for dizziness (Patient not taking: Reported on 06/30/2023)     meloxicam  (MOBIC ) 15 MG tablet TAKE 1 TABLET BY MOUTH EVERY DAY AS NEEDED FOR PAIN (Patient not taking: Reported on 06/30/2023) 90 tablet 1   methocarbamol  (ROBAXIN -750) 750 MG tablet Take 1 tablet (750 mg total) by mouth every 8 (eight) hours as needed for muscle spasms. (Patient not taking: Reported on 06/30/2023) 90 tablet 1    metoprolol  succinate (TOPROL -XL) 50 MG 24 hr tablet Take 50 mg by mouth daily. (Patient not taking: Reported on 06/30/2023)     montelukast  (SINGULAIR ) 10 MG tablet TAKE 1 TABLET BY MOUTH EVERYDAY AT BEDTIME (Patient not taking: Reported on 07/29/2024) 90 tablet 3   norethindrone  (MICRONOR ) 0.35 MG tablet Take 1 tablet (0.35 mg total) by mouth daily. (Patient not taking: Reported on 07/29/2024) 28 tablet 13   promethazine -dextromethorphan (PROMETHAZINE -DM) 6.25-15 MG/5ML syrup Take 5 mLs by mouth 4 (four) times daily as needed. (Patient not taking: Reported on 06/30/2023) 118 mL 0   rizatriptan  (MAXALT ) 10 MG tablet Take 1 tablet (10 mg total) by mouth  2 (two) times daily as needed. (Patient not taking: Reported on 06/30/2023) 90 tablet 0   [DISCONTINUED] amitriptyline  (ELAVIL ) 25 MG tablet Take 1 tablet (25 mg total) by mouth at bedtime. 90 tablet 0   [DISCONTINUED] promethazine  (PHENERGAN ) 12.5 MG tablet Take 1 tablet (12.5 mg total) by mouth every 8 (eight) hours as needed for nausea or vomiting. 20 tablet 1   No current facility-administered medications on file prior to visit.     Review of Systems:  All pertinent positive/negative included in HPI, all other review of systems are negative   Objective:  Physical Exam BP 108/73   Pulse 90   Wt 171 lb 12.8 oz (77.9 kg)   LMP 07/15/2024   BMI 29.49 kg/m  CONSTITUTIONAL: Well-developed, well-nourished female in no acute distress.  EYES: EOM intact ENT: Normocephalic CARDIOVASCULAR: Regular rate  RESPIRATORY: Normal rate.  MUSCULOSKELETAL: Normal ROM SKIN: Warm, dry without erythema  NEUROLOGICAL: Alert, oriented, CN II-XII grossly intact, Appropriate balance PSYCH: Normal behavior, mood   Assessment & Plan:  Assessment: 1. Migraine with aura and without status migrainosus, not intractable      Plan: Nurtec 75mg  every other day - can be used as prevention AND abortive migraine therapy.  However, just in case it is not fully  effective, I have also provided Sumatriptan , Zofran . She can add otc ibuprofen  early in the headache to help sumatriptan  be more effective.  Pt advised flexeril  can help with headache (she uses for other cause).   Follow-up in 3 months or sooner PRN  51 minutes of face to face patient contact this encounter.  Nance Gretta Darice FORBES, PA-C 07/29/2024 9:03 AM

## 2024-08-01 ENCOUNTER — Encounter: Payer: Self-pay | Admitting: *Deleted

## 2024-08-03 NOTE — Progress Notes (Signed)
°  [facility] - Unable To Enroll Patient  A [facility] Coordinator has successfully reached patient.   Dominique Navarro, was identified by provider referral as a candidate for [facility] care management services.   At this time, Dominique Navarro declined [facility] care management services.  Please consider discussing the benefits of [facility] with Dominique Navarro and referring again at a later date.  YASHIKA DREW    [address]; [place], [place] [address] l  [email or web address] l [phone]   For more information on [facility] services, click here.   [facility] Centralized Support Referral Closure Note Dominique Navarro was referred by provider for assistance with health-related social needs:  Care concerns addressed as described above. No further action needed at this time. Closing case.  Provider notified on: 08/03/24.    YASHIKA DREW    [address]; [place], [place] [address] l  [email or web address] l [phone]   For more information on [facility] services, click here.

## 2024-09-13 ENCOUNTER — Ambulatory Visit
Admission: EM | Admit: 2024-09-13 | Discharge: 2024-09-13 | Disposition: A | Discharge status: Home / Self Care | Attending: Emergency Medicine | Admitting: Emergency Medicine

## 2024-09-13 DIAGNOSIS — J069 Acute upper respiratory infection, unspecified: Secondary | ICD-10-CM

## 2024-09-13 LAB — POC COVID19/FLU A&B COMBO
Covid Antigen, POC: NEGATIVE
Influenza A Antigen, POC: NEGATIVE
Influenza B Antigen, POC: NEGATIVE

## 2024-09-13 NOTE — ED Triage Notes (Signed)
 Patient presents to UC for nasal congestion, dizziness, and cough x 3 days. Treating with dayquil with no improvement.   Denies fever.
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
## 2024-09-13 NOTE — Discharge Instructions (Addendum)
 The COVID and flu tests are negative.   Take Tylenol or ibuprofen as needed for fever or discomfort.  Take plain Mucinex as needed for congestion.  Rest and keep yourself hydrated.    Follow-up with your primary care provider if your symptoms are not improving.

## 2024-11-18 ENCOUNTER — Encounter: Admitting: Physician Assistant
